# Patient Record
Sex: Female | Born: 1960
Health system: Southern US, Community
[De-identification: ages and names within clinical notes are randomized; demographics above are authoritative.]

## PROBLEM LIST (undated history)

## (undated) DIAGNOSIS — D649 Anemia, unspecified: Secondary | ICD-10-CM

## (undated) DIAGNOSIS — T7840XA Allergy, unspecified, initial encounter: Secondary | ICD-10-CM

## (undated) DIAGNOSIS — M8580 Other specified disorders of bone density and structure, unspecified site: Secondary | ICD-10-CM

## (undated) DIAGNOSIS — K219 Gastro-esophageal reflux disease without esophagitis: Secondary | ICD-10-CM

## (undated) DIAGNOSIS — F32A Depression, unspecified: Secondary | ICD-10-CM

## (undated) DIAGNOSIS — J381 Polyp of vocal cord and larynx: Secondary | ICD-10-CM

## (undated) DIAGNOSIS — G43909 Migraine, unspecified, not intractable, without status migrainosus: Secondary | ICD-10-CM

## (undated) DIAGNOSIS — E7800 Pure hypercholesterolemia, unspecified: Secondary | ICD-10-CM

## (undated) DIAGNOSIS — J309 Allergic rhinitis, unspecified: Secondary | ICD-10-CM

## (undated) DIAGNOSIS — H269 Unspecified cataract: Secondary | ICD-10-CM

## (undated) DIAGNOSIS — F419 Anxiety disorder, unspecified: Secondary | ICD-10-CM

## (undated) HISTORY — DX: Anemia, unspecified: D64.9

## (undated) HISTORY — DX: Migraine, unspecified, not intractable, without status migrainosus: G43.909

## (undated) HISTORY — DX: Anxiety disorder, unspecified: F41.9

## (undated) HISTORY — DX: Gastro-esophageal reflux disease without esophagitis: K21.9

## (undated) HISTORY — DX: Pure hypercholesterolemia, unspecified: E78.00

## (undated) HISTORY — DX: Other specified disorders of bone density and structure, unspecified site: M85.80

## (undated) HISTORY — DX: Polyp of vocal cord and larynx: J38.1

## (undated) HISTORY — DX: Unspecified cataract: H26.9

## (undated) HISTORY — DX: Allergic rhinitis, unspecified: J30.9

## (undated) HISTORY — PX: OTHER SURGICAL HISTORY: SHX169

## (undated) HISTORY — DX: Allergy, unspecified, initial encounter: T78.40XA

## (undated) HISTORY — DX: Depression, unspecified: F32.A

---

## 2004-12-01 ENCOUNTER — Ambulatory Visit: Payer: Self-pay | Admitting: Obstetrics and Gynecology

## 2004-12-08 ENCOUNTER — Ambulatory Visit: Payer: Self-pay | Admitting: Obstetrics and Gynecology

## 2005-08-09 ENCOUNTER — Ambulatory Visit: Payer: Self-pay | Admitting: Obstetrics and Gynecology

## 2006-08-25 ENCOUNTER — Ambulatory Visit: Payer: Self-pay | Admitting: Obstetrics and Gynecology

## 2007-12-20 ENCOUNTER — Ambulatory Visit: Payer: Self-pay | Admitting: Obstetrics and Gynecology

## 2009-09-16 ENCOUNTER — Ambulatory Visit: Payer: Self-pay

## 2009-09-30 ENCOUNTER — Ambulatory Visit: Payer: Self-pay

## 2009-12-03 ENCOUNTER — Ambulatory Visit: Payer: Self-pay

## 2010-08-21 ENCOUNTER — Ambulatory Visit: Payer: Self-pay | Admitting: Unknown Physician Specialty

## 2011-03-30 ENCOUNTER — Ambulatory Visit: Payer: Self-pay

## 2012-01-12 IMAGING — MG MAM DGTL SCREENING MAMMO W/CAD
1 series · 4 of 4 positions shown · non-contrast
Comparison: none

REASON FOR EXAM: screening
COMMENTS:  Submitted by practice: [facility] Scheduled by user: Heli-Maija
[place]in[place]

[R CC · right · 4 of 4 slices shown]
[im 1/4]
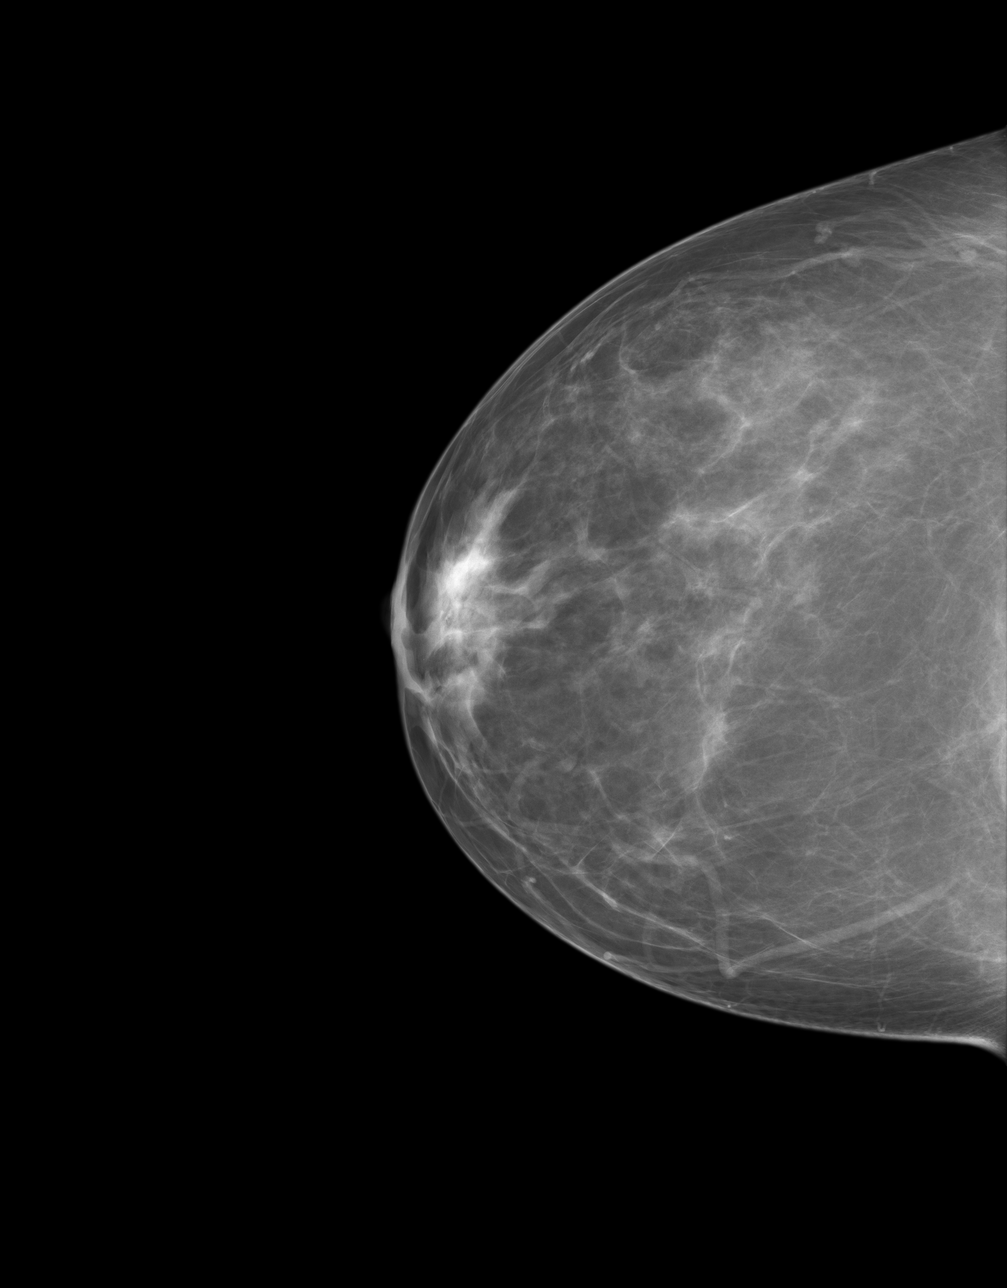
[im 2/4]
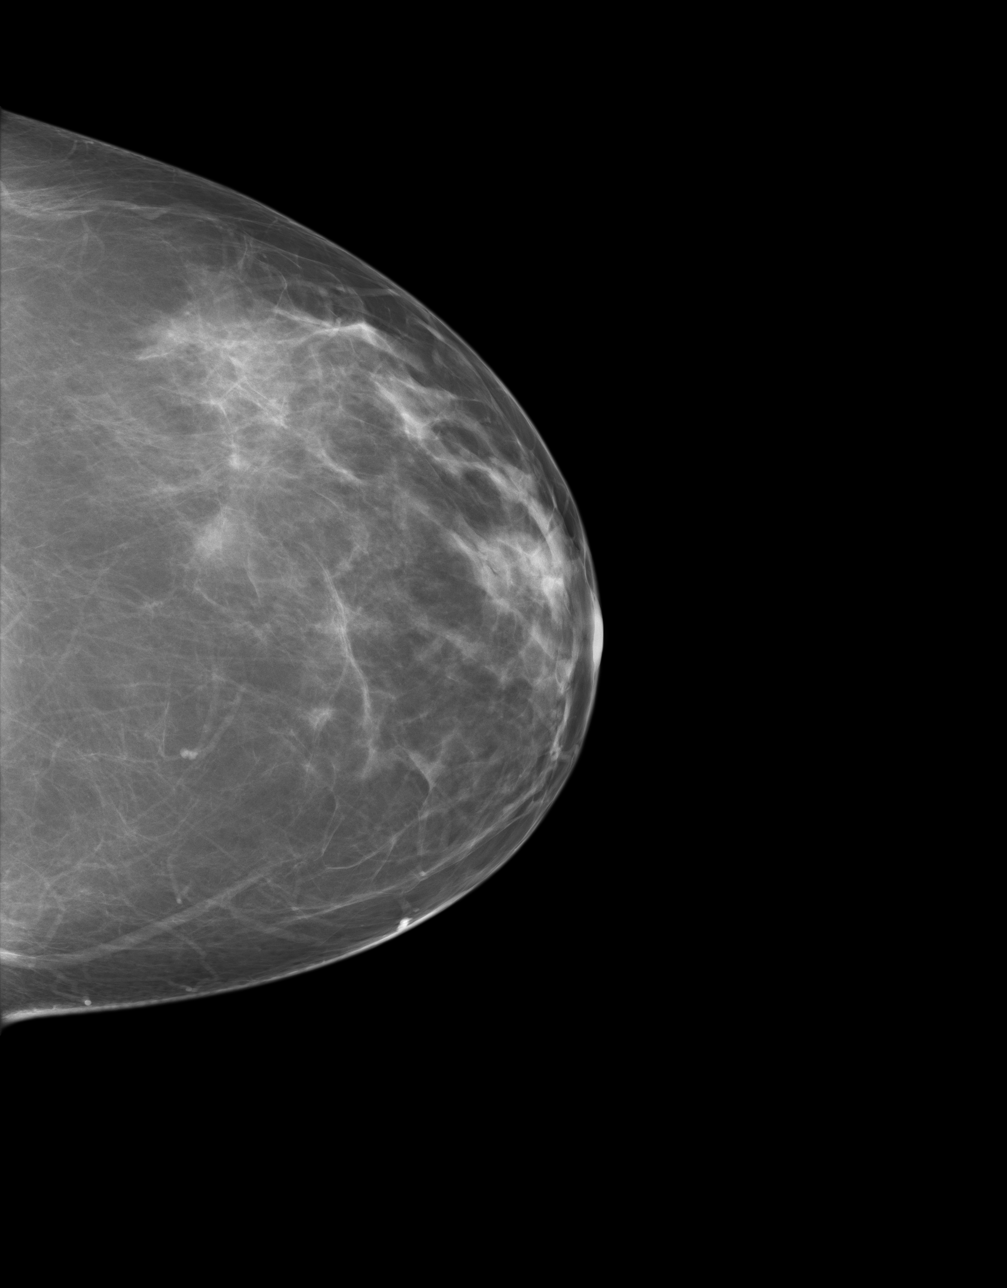
[im 3/4]
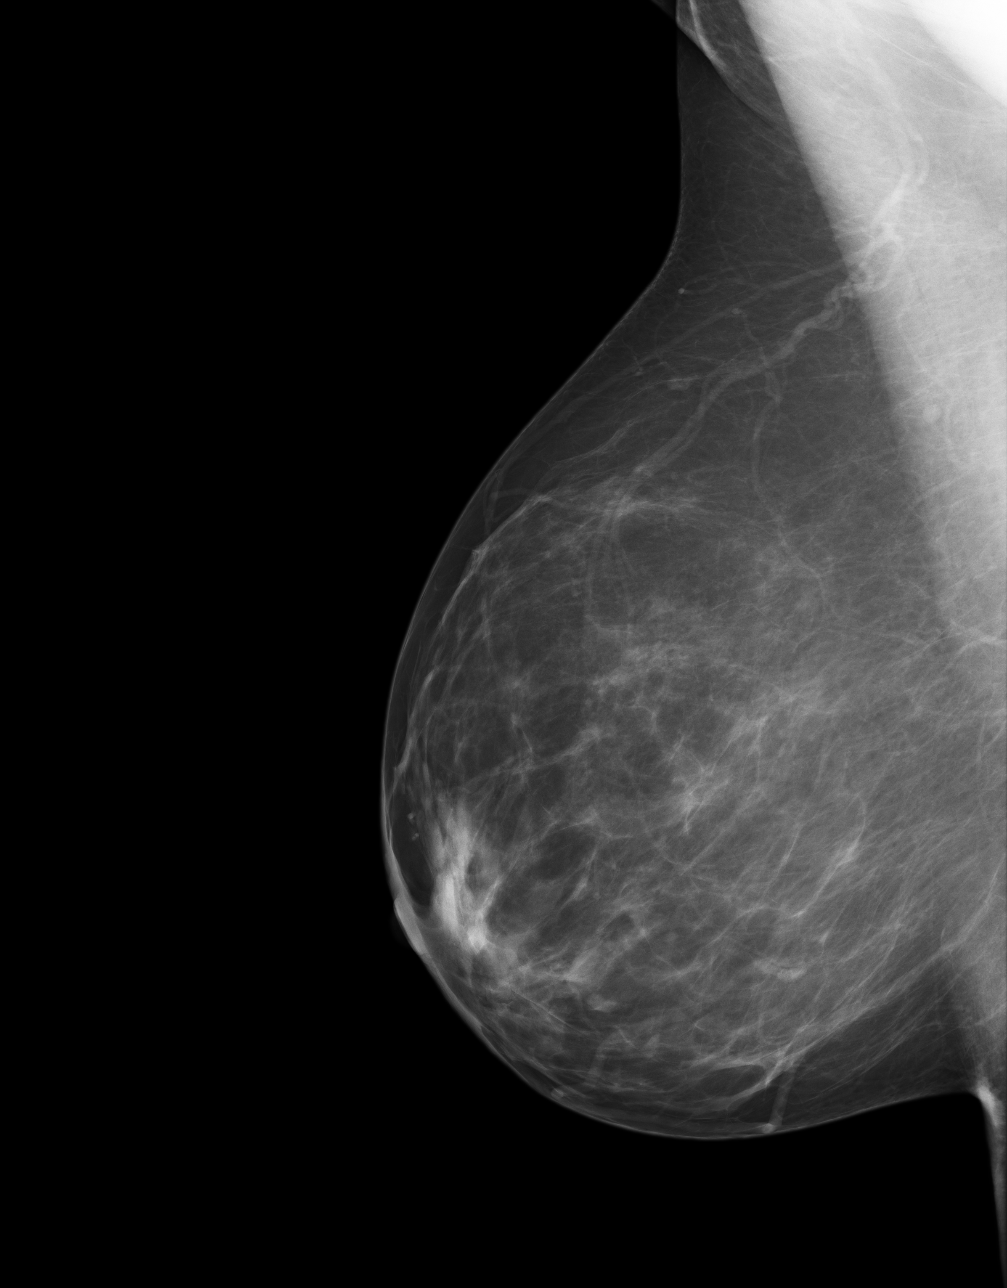
[im 4/4]
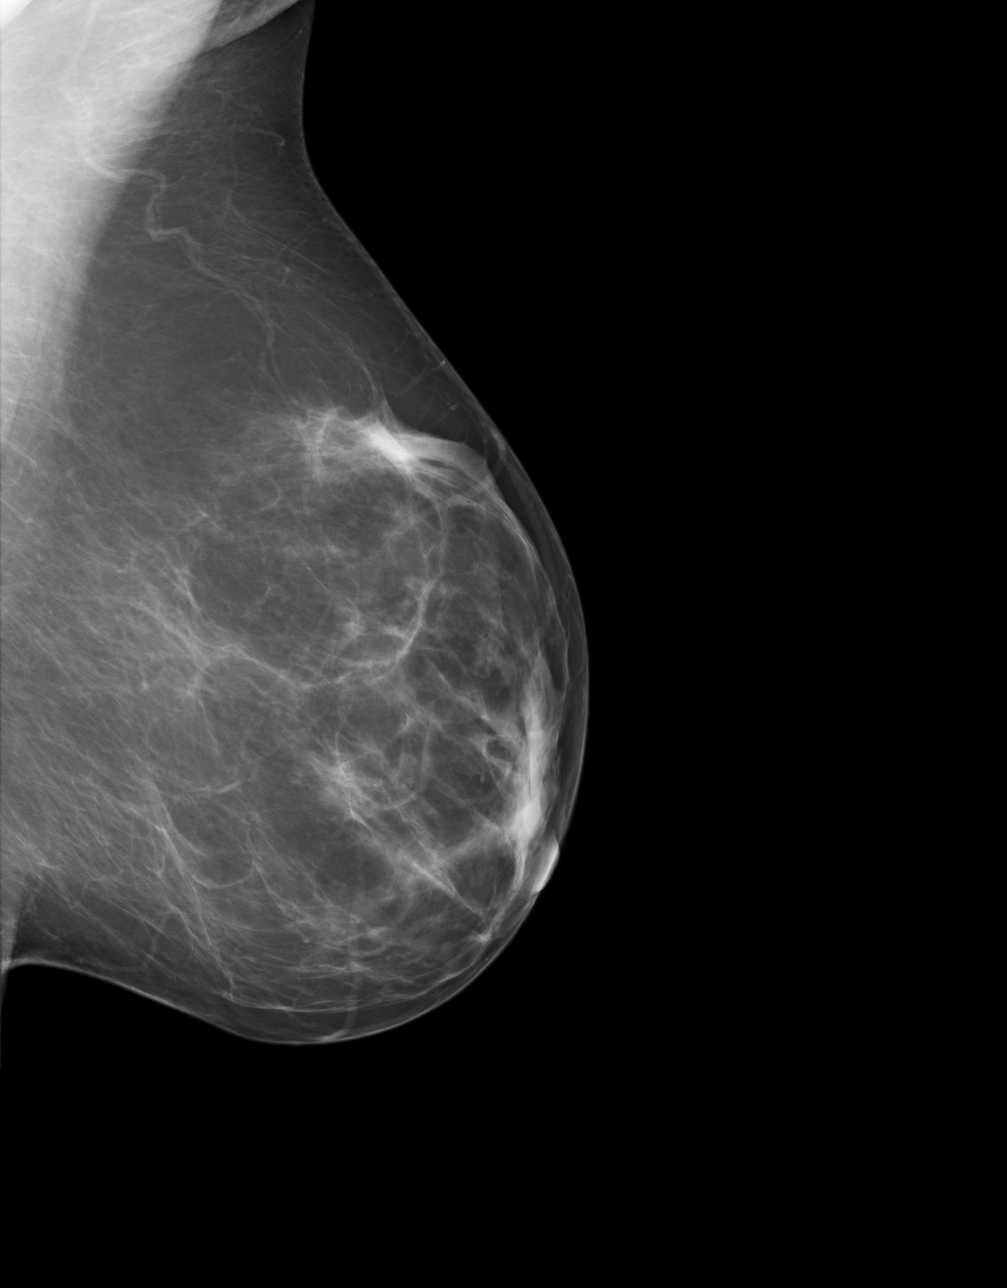

[4 of 4 positions shown; findings below may reference images not displayed]

PROCEDURE:     MAM - MAM DGTL SCREENING MAMMO W/CAD  - September 16, 2009  [DATE]

RESULT:       Comparison is made to prior studies dated 11/25/99, 08/25/06 and
12/20/07.

The breasts demonstrate a heterogenous parenchymal pattern.  An area of
asymmetric partially spiculated density projects in the central portion of
the left breast in a slightly lateral location approximately 8.0 cm from the
nipple.  Further evaluation with magnification compression imaging is
recommended. There is further mammographic evidence to suggest malignancy.
IMPRESSION: BI-RADS:  Category 0- Needs Additional Imaging.

A negative mammogram report does not preclude biopsy or other evaluation of
a clinically palpable or otherwise suspicious mass or lesion. Breast cancer
may not be detected by mammography in up to 10% of cases.

## 2012-05-30 ENCOUNTER — Ambulatory Visit: Payer: Self-pay

## 2013-01-18 HISTORY — PX: COLONOSCOPY: SHX174

## 2013-03-24 ENCOUNTER — Emergency Department: Payer: Self-pay | Admitting: Emergency Medicine

## 2013-08-21 ENCOUNTER — Ambulatory Visit: Payer: Self-pay

## 2013-10-12 ENCOUNTER — Ambulatory Visit: Payer: Self-pay | Admitting: Gastroenterology

## 2013-10-12 LAB — HM COLONOSCOPY

## 2015-08-27 ENCOUNTER — Other Ambulatory Visit: Payer: Self-pay | Admitting: Certified Nurse Midwife

## 2015-08-27 DIAGNOSIS — Z1231 Encounter for screening mammogram for malignant neoplasm of breast: Secondary | ICD-10-CM

## 2015-09-12 ENCOUNTER — Other Ambulatory Visit: Payer: Self-pay | Admitting: Certified Nurse Midwife

## 2015-09-12 DIAGNOSIS — Z13820 Encounter for screening for osteoporosis: Secondary | ICD-10-CM

## 2015-09-17 ENCOUNTER — Ambulatory Visit: Payer: Self-pay

## 2015-10-13 ENCOUNTER — Ambulatory Visit
Admission: RE | Admit: 2015-10-13 | Discharge: 2015-10-13 | Disposition: A | Discharge status: Home / Self Care | Payer: 59 | Source: Ambulatory Visit | Attending: Certified Nurse Midwife | Admitting: Certified Nurse Midwife

## 2015-10-13 ENCOUNTER — Other Ambulatory Visit: Payer: Self-pay | Admitting: Certified Nurse Midwife

## 2015-10-13 DIAGNOSIS — Z13820 Encounter for screening for osteoporosis: Secondary | ICD-10-CM | POA: Insufficient documentation

## 2015-10-13 DIAGNOSIS — M8580 Other specified disorders of bone density and structure, unspecified site: Secondary | ICD-10-CM | POA: Diagnosis not present

## 2015-10-13 DIAGNOSIS — Z1231 Encounter for screening mammogram for malignant neoplasm of breast: Secondary | ICD-10-CM

## 2015-10-17 ENCOUNTER — Other Ambulatory Visit: Payer: Self-pay | Admitting: *Deleted

## 2015-10-17 ENCOUNTER — Inpatient Hospital Stay
Admission: RE | Admit: 2015-10-17 | Discharge: 2015-10-17 | Disposition: A | Discharge status: Home / Self Care | Payer: Self-pay | Source: Ambulatory Visit | Attending: *Deleted | Admitting: *Deleted

## 2015-10-17 DIAGNOSIS — Z9289 Personal history of other medical treatment: Secondary | ICD-10-CM

## 2016-04-13 DIAGNOSIS — G43019 Migraine without aura, intractable, without status migrainosus: Secondary | ICD-10-CM | POA: Diagnosis not present

## 2016-04-15 DIAGNOSIS — G43109 Migraine with aura, not intractable, without status migrainosus: Secondary | ICD-10-CM | POA: Diagnosis not present

## 2016-04-20 DIAGNOSIS — H61119 Acquired deformity of pinna, unspecified ear: Secondary | ICD-10-CM | POA: Diagnosis not present

## 2016-04-30 DIAGNOSIS — R200 Anesthesia of skin: Secondary | ICD-10-CM | POA: Diagnosis not present

## 2016-04-30 DIAGNOSIS — G43119 Migraine with aura, intractable, without status migrainosus: Secondary | ICD-10-CM | POA: Diagnosis not present

## 2016-05-07 ENCOUNTER — Other Ambulatory Visit: Payer: Self-pay | Admitting: Neurology

## 2016-05-07 DIAGNOSIS — G43119 Migraine with aura, intractable, without status migrainosus: Secondary | ICD-10-CM

## 2016-05-21 ENCOUNTER — Ambulatory Visit
Admission: RE | Admit: 2016-05-21 | Discharge: 2016-05-21 | Disposition: A | Discharge status: Home / Self Care | Payer: Commercial Managed Care - HMO | Source: Ambulatory Visit | Attending: Neurology | Admitting: Neurology

## 2016-05-21 DIAGNOSIS — G43119 Migraine with aura, intractable, without status migrainosus: Secondary | ICD-10-CM

## 2016-05-21 DIAGNOSIS — R42 Dizziness and giddiness: Secondary | ICD-10-CM | POA: Diagnosis not present

## 2016-05-21 MED ORDER — GADOBENATE DIMEGLUMINE 529 MG/ML IV SOLN
20.0000 mL | Freq: Once | INTRAVENOUS | Status: AC | PRN
  Administered 2016-05-21: 18 mL via INTRAVENOUS

## 2016-09-28 ENCOUNTER — Other Ambulatory Visit: Payer: Self-pay | Admitting: Certified Nurse Midwife

## 2016-09-28 NOTE — Telephone Encounter (Signed)
Please advise for refill. Thank you.  

## 2016-10-27 ENCOUNTER — Other Ambulatory Visit: Payer: Self-pay | Admitting: Certified Nurse Midwife

## 2016-11-29 ENCOUNTER — Other Ambulatory Visit: Payer: Self-pay | Admitting: Certified Nurse Midwife

## 2016-12-06 ENCOUNTER — Encounter: Payer: Self-pay | Admitting: Certified Nurse Midwife

## 2016-12-06 ENCOUNTER — Ambulatory Visit (INDEPENDENT_AMBULATORY_CARE_PROVIDER_SITE_OTHER): Payer: 59 | Admitting: Certified Nurse Midwife

## 2016-12-06 VITALS — BP 128/84 | HR 81 | Ht 65.75 in | Wt 202.0 lb

## 2016-12-06 DIAGNOSIS — G43909 Migraine, unspecified, not intractable, without status migrainosus: Secondary | ICD-10-CM | POA: Insufficient documentation

## 2016-12-06 DIAGNOSIS — E7800 Pure hypercholesterolemia, unspecified: Secondary | ICD-10-CM | POA: Insufficient documentation

## 2016-12-06 DIAGNOSIS — J309 Allergic rhinitis, unspecified: Secondary | ICD-10-CM | POA: Insufficient documentation

## 2016-12-06 DIAGNOSIS — M8580 Other specified disorders of bone density and structure, unspecified site: Secondary | ICD-10-CM | POA: Insufficient documentation

## 2016-12-06 DIAGNOSIS — Z01419 Encounter for gynecological examination (general) (routine) without abnormal findings: Secondary | ICD-10-CM

## 2016-12-06 DIAGNOSIS — Z124 Encounter for screening for malignant neoplasm of cervix: Secondary | ICD-10-CM

## 2016-12-06 DIAGNOSIS — Z1231 Encounter for screening mammogram for malignant neoplasm of breast: Secondary | ICD-10-CM | POA: Diagnosis not present

## 2016-12-06 DIAGNOSIS — J381 Polyp of vocal cord and larynx: Secondary | ICD-10-CM | POA: Insufficient documentation

## 2016-12-06 DIAGNOSIS — Z1239 Encounter for other screening for malignant neoplasm of breast: Secondary | ICD-10-CM

## 2016-12-06 DIAGNOSIS — G43119 Migraine with aura, intractable, without status migrainosus: Secondary | ICD-10-CM | POA: Insufficient documentation

## 2016-12-06 DIAGNOSIS — M85851 Other specified disorders of bone density and structure, right thigh: Secondary | ICD-10-CM | POA: Insufficient documentation

## 2016-12-06 MED ORDER — PROGESTERONE MICRONIZED 100 MG PO CAPS: 100.0000 mg | ORAL_CAPSULE | Freq: Every day | ORAL | 3 refills | Status: DC

## 2016-12-06 MED ORDER — ESTRADIOL 0.52 MG/0.87 GM (0.06%) TD GEL: 1.0000 "application " | Freq: Every day | TRANSDERMAL | 4 refills | Status: DC

## 2016-12-06 NOTE — Progress Notes (Signed)
Gynecology Annual Exam  PCP: Patient, No Pcp Per  Chief Complaint:  Chief Complaint  Patient presents with  . Gynecologic Exam    History of Present Illness:Margaret Fuller presents today for her annual exam. She is a 56 year old Caucasian/White female , G 3 P 1 0 2 1 , whose LMP was 6 years ago. . She is having no significant GYN problems.  Her menses are absent and she is postmenopausal. She currently uses Elestrin -1pump daily and Prometrium 100 mgm daily.   She has had no spotting.   The patient's past medical history is remarkable for migraine headaches, hypercholesterolemia, allergic rhinitis, and vocal cord polyps (has had excision x 3 ) Since her last annual GYN exam dated 10/13/2015 , she has been diagnosed with some anxiety/depression by her neurologist and started on some Zoloft. This problem has arisen from a stressful relationship with a boss at work. .  She is sexually active. She does not have vaginal dryness.   Her most recent pap smear was obtained 09/11/2015 and was NIL Her most recent mammogram obtained on 10/13/2015 was normal and revealed no significant changes. There is no family history of breast cancer. There is no family history of ovarian cancer. The patient does do monthly self breast exams.  She had a colonoscopy in 2015 that was normal. Her next colonoscopy is due in 5 years.  She had a recent DEXA scan obtained in 10/13/2015 that showed stable osteopenia. T score of spine -1.5 and of femur -1.6  The patient does not smoke.  The patient does drink an average of 1-2/week. The patient does not use illegal drugs.  The patient does not exercise.  The patient does get adequate calcium in her diet and wih her supplement.  She has hypercholesterolemia and has stopped taking her Welchol. Has not found a new PCP yet. Given some names of PCPs in area taking new patients      Review of Systems: Review of Systems  Constitutional: Negative for chills, fever  and weight loss.       Positive for weight gain of 5#  HENT: Negative for congestion, sinus pain and sore throat.   Eyes: Negative for blurred vision and pain.  Respiratory: Negative for hemoptysis, shortness of breath and wheezing.   Cardiovascular: Negative for chest pain, palpitations and leg swelling.  Gastrointestinal: Positive for heartburn. Negative for abdominal pain, blood in stool, diarrhea, nausea and vomiting.  Genitourinary: Negative for dysuria, frequency, hematuria and urgency.  Musculoskeletal: Negative for back pain, joint pain and myalgias.  Skin: Negative for itching and rash.  Neurological: Positive for headaches. Negative for dizziness and tingling.  Endo/Heme/Allergies: Negative for environmental allergies and polydipsia. Does not bruise/bleed easily.       Negative for hirsutism   Psychiatric/Behavioral: Positive for depression. The patient is nervous/anxious. The patient does not have insomnia.     Past Medical History:  Past Medical History:  Diagnosis Date  . Allergic rhinitis   . Anemia   . Hypercholesterolemia   . Migraine   . Osteopenia   . Vocal cord polyp     Past Surgical History:  Past Surgical History:  Procedure Laterality Date  . COLONOSCOPY  2015   negative  . excision of polyps from vocal cords  1970, 1984, 1985    Family History:  Family History  Problem Relation Age of Onset  . Diabetes Mother   . Colon polyps Mother 42  . Heart disease  Mother        MI has pacemaker  . Hypertension Mother   . Diabetes Father   . Skin cancer Maternal Aunt   . Squamous cell carcinoma Sister        skin on arm    Social History:  Social History   Socioeconomic History  . Marital status: Married    Spouse name: Not on file  . Number of children: 1  . Years of education: Not on file  . Highest education level: Not on file  Social Needs  . Financial resource strain: Not on file  . Food insecurity - worry: Not on file  . Food insecurity -  inability: Not on file  . Transportation needs - medical: Not on file  . Transportation needs - non-medical: Not on file  Occupational History  . Occupation: Environmental health practitionerAdministrative Assistant, Passenger transport manageregistrar  Tobacco Use  . Smoking status: Never Smoker  . Smokeless tobacco: Never Used  Substance and Sexual Activity  . Alcohol use: Yes    Comment: 1/week  . Drug use: No  . Sexual activity: Yes    Birth control/protection: Post-menopausal  Other Topics Concern  . Not on file  Social History Narrative   Daughter going to [employer] and taking theater and communications    Allergies:  Allergies  Allergen Reactions  . Statins Other (See Comments)    Myopathy     Medications: Prior to Admission medications   Medication Sig Start Date End Date Taking? Authorizing Provider  B Complex Vitamins (VITAMIN-B COMPLEX) TABS Take by mouth.   Yes [provider]  Calcium Carbonate-Vitamin D3 (CALCIUM 600-D) 600-400 MG-UNIT TABS Take by mouth.   Yes [provider]  cetirizine (ZYRTEC) 10 MG tablet Take by mouth.   Yes [provider]  Estradiol (ELESTRIN) 0.52 MG/0.87 GM (0.06%) GEL Apply topically.   Yes [provider]  FLUZONE QUADRIVALENT 0.5 ML injection inject 0.5 milliliter intramuscularly 10/17/16  Yes [provider]  Misc Natural Products (PETADOLEX 75) 75 MG CAPS Take by mouth.   Yes [provider]  Multiple Vitamin (MULTIVITAMIN) tablet Take by mouth.   Yes [provider]  progesterone (PROMETRIUM) 100 MG capsule TAKE 1 CAPSULE BY MOUTH EVERY DAY 11/29/16  Yes Farrel ConnersGutierrez, Heaven Meeker, CNM  sertraline (ZOLOFT) 50 MG tablet Take by mouth. 12/02/16 03/02/17 Yes [provider]  SUMAtriptan (IMITREX) 100 MG tablet Take by mouth. 12/02/16  Yes [provider]  tretinoin (RETIN-A) 0.01 % gel Apply topically.   Yes [provider]    Physical Exam Vitals: BP 128/84   Pulse 81   Ht 5' 5.75" (1.67 m)   Wt 202  lb (91.6 kg)   BMI 32.85 kg/m   General: WF in  NAD HEENT: normocephalic, anicteric Neck: no thyroid enlargement, no palpable nodules, no cervical lymphadenopathy  Pulmonary: No increased work of breathing, CTAB Cardiovascular: RRR, without murmur  Breast: Breast symmetrical, no tenderness, no palpable nodules or masses, no skin or nipple retraction present, no nipple discharge.  No axillary, infraclavicular or supraclavicular lymphadenopathy. Abdomen: Soft, non-tender, non-distended.  Umbilicus without lesions.  No hepatomegaly or masses palpable. No evidence of hernia. Genitourinary:  External: Normal external female genitalia.  Normal urethral meatus, normal Bartholin's and Skene's glands.    Vagina: Normal vaginal mucosa, no evidence of prolapse.    Cervix: Grossly normal in appearance, no bleeding, non-tender  Uterus: Anteverted, normal size, shape, and consistency, mobile, and non-tender  Adnexa: No adnexal masses, non-tender  Rectal:  deferred  Lymphatic: no evidence of inguinal lymphadenopathy Extremities: no edema, erythema, or tenderness Neurologic: Grossly intact Psychiatric: mood appropriate, affect full     Assessment: 56 y.o. [id number]   Plan:   1) Breast cancer screening - recommend monthly self breast exam and annual screening mammograms. Mammogram was ordered today. [facility]  2) Colon cancer screening: Patient stated that Dr Deon PillingSkilski wanted her to have another colonoscopy in 3 years (this year), but they agreed on every 5 years.  3) Cervical cancer screening - Pap was done.  4) Encouraged patient to get a PCP. Discussed exercise to help with weight loss and in prevention of osteoporosis  5) RTO 1 year for annual and prn.   Farrel Connersolleen Samuele Storey, CNM

## 2016-12-08 LAB — IGP, APTIMA HPV
HPV Aptima: NEGATIVE
PAP Smear Comment: 0

## 2017-01-04 ENCOUNTER — Other Ambulatory Visit: Payer: Self-pay

## 2017-01-04 MED ORDER — ESTRADIOL 0.52 MG/0.87 GM (0.06%) TD GEL: 1.0000 "application " | Freq: Every day | TRANSDERMAL | 1 refills | Status: DC

## 2017-12-08 ENCOUNTER — Other Ambulatory Visit: Payer: Self-pay | Admitting: Certified Nurse Midwife

## 2018-01-09 ENCOUNTER — Other Ambulatory Visit: Payer: Self-pay | Admitting: Certified Nurse Midwife

## 2018-01-10 ENCOUNTER — Other Ambulatory Visit: Payer: Self-pay | Admitting: Certified Nurse Midwife

## 2018-02-02 NOTE — Progress Notes (Signed)
Gynecology Annual Exam  PCP: Patient, No Pcp Per  Chief Complaint:  Chief Complaint  Patient presents with  . Gynecologic Exam    No complaints    History of Present Illness:Margaret Fuller presents today for her annual exam. She is a 58 year old Caucasian/White female , G 3 P 1 0 2 1 , whose LMP was 7-8 years ago. . She is having no significant GYN problems.  Her menses are absent and she is postmenopausal. She currently uses Elestrin -1pump daily and Prometrium 100 mgm daily.   She has had no spotting.   The patient's past medical history is remarkable for migraine headaches, hypercholesterolemia, allergic rhinitis, anxiety/depression, and vocal cord polyps (has had excision x 3 ) Since her last annual GYN exam dated 12/06/2016 , she has had no significant changes in her health.  .  She is sexually active. She does not have vaginal dryness.   Her most recent pap smear was obtained 12/06/2016 and was NIL with negative HRHPV. Her most recent mammogram obtained on 10/13/2015 was normal and revealed no significant changes. There is no family history of breast cancer. There is no family history of ovarian cancer. The patient does do monthly self breast exams.  She had a colonoscopy in 2015 that was normal. Her next colonoscopy is due this year She had a recent DEXA scan obtained in 10/13/2015 that showed stable osteopenia. T score of spine -1.5 and of femur -1.6  The patient does not smoke.  The patient does drink an average of 1-2/week. The patient does not use illegal drugs.  The patient does not exercise.  The patient does get adequate calcium in her diet.  She has hypercholesterolemia and has stopped taking her Welchol in the past. Has not found a new PCP yet. Given some names of PCPs in area taking new patients      Review of Systems: Review of Systems  Constitutional: Negative for chills, fever and weight loss.       Positive for weight loss of 6#  HENT: Negative  for congestion, sinus pain and sore throat.   Eyes: Negative for blurred vision and pain.  Respiratory: Negative for hemoptysis, shortness of breath and wheezing.   Cardiovascular: Negative for chest pain, palpitations and leg swelling.  Gastrointestinal: Positive for heartburn. Negative for abdominal pain, blood in stool, diarrhea, nausea and vomiting.  Genitourinary: Negative for dysuria, frequency, hematuria and urgency.  Musculoskeletal: Negative for back pain, joint pain and myalgias.  Skin: Negative for itching and rash.  Neurological: Positive for headaches. Negative for dizziness and tingling.  Endo/Heme/Allergies: Negative for environmental allergies and polydipsia. Does not bruise/bleed easily.       Negative for hirsutism   Psychiatric/Behavioral: Negative for depression. The patient is nervous/anxious. The patient does not have insomnia.     Past Medical History:  Past Medical History:  Diagnosis Date  . Allergic rhinitis   . Anemia   . Hypercholesterolemia   . Migraine   . Osteopenia   . Vocal cord polyp     Past Surgical History:  Past Surgical History:  Procedure Laterality Date  . COLONOSCOPY  2015   negative  . excision of polyps from vocal cords  1970, 1984, 1985    Family History:  Family History  Problem Relation Age of Onset  . Diabetes Mother   . Colon polyps Mother 78  . Heart disease Mother        MI has pacemaker  .  Hypertension Mother   . Diabetes Father   . Skin cancer Maternal Aunt   . Squamous cell carcinoma Sister        skin on arm    Social History:  Social History   Socioeconomic History  . Marital status: Married    Spouse name: Not on file  . Number of children: 1  . Years of education: Not on file  . Highest education level: Not on file  Occupational History  . Occupation: Environmental health practitionerAdministrative Assistant, Passenger transport manageregistrar  Social Needs  . Financial resource strain: Not on file  . Food insecurity:    Worry: Not on file    Inability:  Not on file  . Transportation needs:    Medical: Not on file    Non-medical: Not on file  Tobacco Use  . Smoking status: Never Smoker  . Smokeless tobacco: Never Used  Substance and Sexual Activity  . Alcohol use: Yes    Comment: 1/week  . Drug use: No  . Sexual activity: Yes    Birth control/protection: Post-menopausal  Lifestyle  . Physical activity:    Days per week: 3 days    Minutes per session: 30 min  . Stress: Only a little  Relationships  . Social connections:    Talks on phone: Three times a week    Gets together: Once a week    Attends religious service: More than 4 times per year    Active member of club or organization: No    Attends meetings of clubs or organizations: Never    Relationship status: Married  . Intimate partner violence:    Fear of current or ex partner: No    Emotionally abused: No    Physically abused: No    Forced sexual activity: No  Other Topics Concern  . Not on file  Social History Narrative   Daughter going to [employer] and taking theater and communications    Allergies:  Allergies  Allergen Reactions  . Statins Other (See Comments)    Myopathy     Medications:  Current Outpatient Medications on File Prior to Visit  Medication Sig Dispense Refill  . B Complex Vitamins (VITAMIN-B COMPLEX) TABS Take by mouth.    . Calcium Carbonate-Vitamin D3 (CALCIUM 600-D) 600-400 MG-UNIT TABS Take by mouth.    . cetirizine (ZYRTEC) 10 MG tablet Take by mouth.    . Misc Natural Products (PETADOLEX 75) 75 MG CAPS Take by mouth.    . Multiple Vitamin (MULTIVITAMIN) tablet Take by mouth.    . sertraline (ZOLOFT) 100 MG tablet     . SUMAtriptan (IMITREX) 100 MG tablet Take by mouth.    Marland Kitchen. FLUZONE QUADRIVALENT 0.5 ML injection inject 0.5 milliliter intramuscularly  0   No current facility-administered medications on file prior to visit.    Physical Exam Vitals: BP (!) 110/7 (BP Location: Right Arm, Patient Position: Sitting, Cuff Size:  Normal)   Pulse 90   Ht 5' 5.75" (1.67 m)   Wt 196 lb (88.9 kg)   BMI 31.88 kg/m   General: WF in  NAD HEENT: normocephalic, anicteric Neck: no thyroid enlargement, no palpable nodules, no cervical lymphadenopathy  Pulmonary: No increased work of breathing, CTAB Cardiovascular: RRR, without murmur  Breast: Breast symmetrical, no tenderness, no palpable nodules or masses, no skin or nipple retraction present, no nipple discharge.  No axillary, infraclavicular or supraclavicular lymphadenopathy. Abdomen: Soft, non-tender, non-distended.  Umbilicus without lesions.  No hepatomegaly or masses palpable. No evidence of hernia.  Genitourinary:  External: Normal external female genitalia.  Normal urethral meatus, normal Bartholin's and Skene's glands.    Vagina: Normal vaginal mucosa, no evidence of prolapse.    Cervix: Grossly normal in appearance, no bleeding, non-tender  Uterus: Retroverted, normal size, shape, and consistency, mobile, and non-tender  Adnexa: No adnexal masses, non-tender  Rectal: deferred  Lymphatic: no evidence of inguinal lymphadenopathy Extremities: no edema, erythema, or tenderness Neurologic: Grossly intact Psychiatric: mood appropriate, affect full     Assessment: 58 y.o. [id number] well woman exam Postmenopausal -on Hormone therapy History of hyperlipidemia  Plan:   1) Breast cancer screening - recommend monthly self breast exam and annual screening mammograms. Mammogram was ordered today. [facility]  2) Colon cancer screening: Patient stated that Dr Ricki RodriguezSkulski wanted her to have another colonoscopy in 3 years (this year), but they agreed on every 5 years because of her mother's history of colon polyps. Patient disinclined to do a colonoscopy due to time away from work and colon cleanse.. Discussed other options for colon cancer screening. Would like to do Cologuard. This was ordered.  3) Cervical cancer screening - Pap was  done.  4) Encouraged patient to get a PCP for treatment of hyperlipidemia.  [facility] [address]  5) Osteopenia: consider getting another DEXA at age 58.  285) RTO 1 year for annual and prn.   Farrel Connersolleen Tasheka Houseman, CNM

## 2018-02-03 ENCOUNTER — Ambulatory Visit (INDEPENDENT_AMBULATORY_CARE_PROVIDER_SITE_OTHER): Payer: 59 | Admitting: Certified Nurse Midwife

## 2018-02-03 ENCOUNTER — Encounter: Payer: Self-pay | Admitting: Certified Nurse Midwife

## 2018-02-03 ENCOUNTER — Other Ambulatory Visit (HOSPITAL_COMMUNITY)
Admission: RE | Admit: 2018-02-03 | Discharge: 2018-02-03 | Disposition: A | Discharge status: Home / Self Care | Payer: 59 | Source: Ambulatory Visit | Attending: Certified Nurse Midwife | Admitting: Certified Nurse Midwife

## 2018-02-03 ENCOUNTER — Other Ambulatory Visit: Payer: Self-pay

## 2018-02-03 VITALS — BP 110/71 | HR 90 | Ht 65.75 in | Wt 196.0 lb

## 2018-02-03 DIAGNOSIS — E7800 Pure hypercholesterolemia, unspecified: Secondary | ICD-10-CM

## 2018-02-03 DIAGNOSIS — Z1239 Encounter for other screening for malignant neoplasm of breast: Secondary | ICD-10-CM

## 2018-02-03 DIAGNOSIS — Z1211 Encounter for screening for malignant neoplasm of colon: Secondary | ICD-10-CM

## 2018-02-03 DIAGNOSIS — Z01419 Encounter for gynecological examination (general) (routine) without abnormal findings: Secondary | ICD-10-CM | POA: Insufficient documentation

## 2018-02-03 DIAGNOSIS — G43809 Other migraine, not intractable, without status migrainosus: Secondary | ICD-10-CM

## 2018-02-03 DIAGNOSIS — Z124 Encounter for screening for malignant neoplasm of cervix: Secondary | ICD-10-CM | POA: Insufficient documentation

## 2018-02-03 MED ORDER — PROGESTERONE MICRONIZED 100 MG PO CAPS
100.0000 mg | ORAL_CAPSULE | Freq: Every day | ORAL | 3 refills | Status: DC
Start: 2018-02-03 — End: 2019-02-07

## 2018-02-03 MED ORDER — ESTRADIOL 0.52 MG/0.87 GM (0.06%) TD GEL: TRANSDERMAL | 3 refills | Status: DC

## 2018-02-07 NOTE — Addendum Note (Signed)
Addended by: Farrel Conners on: 02/07/2018 07:10 PM   Modules accepted: Orders

## 2018-02-08 LAB — CYTOLOGY - PAP: Diagnosis: NEGATIVE

## 2018-02-09 ENCOUNTER — Other Ambulatory Visit: Payer: Self-pay | Admitting: Certified Nurse Midwife

## 2018-03-06 ENCOUNTER — Other Ambulatory Visit: Payer: Self-pay | Admitting: Certified Nurse Midwife

## 2018-03-06 ENCOUNTER — Ambulatory Visit
Admission: RE | Admit: 2018-03-06 | Discharge: 2018-03-06 | Disposition: A | Discharge status: Home / Self Care | Payer: 59 | Source: Ambulatory Visit | Attending: Certified Nurse Midwife | Admitting: Certified Nurse Midwife

## 2018-03-06 DIAGNOSIS — Z1239 Encounter for other screening for malignant neoplasm of breast: Secondary | ICD-10-CM

## 2018-03-06 DIAGNOSIS — N6489 Other specified disorders of breast: Secondary | ICD-10-CM

## 2018-03-06 DIAGNOSIS — Z1231 Encounter for screening mammogram for malignant neoplasm of breast: Secondary | ICD-10-CM | POA: Diagnosis not present

## 2018-03-06 DIAGNOSIS — R928 Other abnormal and inconclusive findings on diagnostic imaging of breast: Secondary | ICD-10-CM

## 2018-03-15 ENCOUNTER — Encounter: Payer: Self-pay | Admitting: Certified Nurse Midwife

## 2018-03-16 ENCOUNTER — Ambulatory Visit
Admission: RE | Admit: 2018-03-16 | Discharge: 2018-03-16 | Disposition: A | Discharge status: Home / Self Care | Payer: 59 | Source: Ambulatory Visit | Attending: Certified Nurse Midwife | Admitting: Certified Nurse Midwife

## 2018-03-16 DIAGNOSIS — R928 Other abnormal and inconclusive findings on diagnostic imaging of breast: Secondary | ICD-10-CM

## 2018-03-16 DIAGNOSIS — N6489 Other specified disorders of breast: Secondary | ICD-10-CM | POA: Insufficient documentation

## 2018-03-17 ENCOUNTER — Other Ambulatory Visit: Payer: Self-pay | Admitting: Certified Nurse Midwife

## 2018-03-17 DIAGNOSIS — N6489 Other specified disorders of breast: Secondary | ICD-10-CM

## 2018-03-17 DIAGNOSIS — R928 Other abnormal and inconclusive findings on diagnostic imaging of breast: Secondary | ICD-10-CM

## 2018-03-21 LAB — COLOGUARD

## 2018-03-27 ENCOUNTER — Telehealth: Payer: Self-pay

## 2018-03-27 NOTE — Telephone Encounter (Signed)
Pt aware of neg cologuard results.

## 2018-07-06 ENCOUNTER — Other Ambulatory Visit: Payer: Self-pay | Admitting: Certified Nurse Midwife

## 2018-07-06 MED ORDER — ELESTRIN 0.52 MG/0.87 GM (0.06%) TD GEL: TRANSDERMAL | 2 refills | Status: DC

## 2019-02-07 ENCOUNTER — Other Ambulatory Visit: Payer: Self-pay | Admitting: Certified Nurse Midwife

## 2019-02-07 MED ORDER — PROGESTERONE MICRONIZED 100 MG PO CAPS
100.0000 mg | ORAL_CAPSULE | Freq: Every day | ORAL | 0 refills | Status: DC
Start: 2019-02-07 — End: 2019-05-07

## 2019-02-11 NOTE — Progress Notes (Addendum)
Gynecology Annual Exam  PCP: Patient, No Pcp Per  Chief Complaint:  Chief Complaint  Patient presents with  . Gynecologic Exam    History of Present Illness:Margaret Fuller presents today for her annual exam. She is a 59 year old Caucasian/White female , G 3 P 1 0 2 1 , whose LMP was 8-9 years ago. . She is having no significant GYN problems.  Her menses are absent and she is postmenopausal. She currently uses Elestrin -1pump daily and Prometrium 100 mgm daily.   She has had no spotting.   The patient's past medical history is remarkable for migraine headaches, hypercholesterolemia, allergic rhinitis, anxiety/depression, and vocal cord polyps (has had excision x 3 ) Since her last annual GYN exam dated 02/03/2018 , she has had no significant changes in her health.  .  She is sexually active. She does not have vaginal dryness.   Her most recent pap smear was obtained 02/03/2018 and was NIL Her most recent mammogram obtained on 03/06/18 and was Birads 0. After additional views and ultrasound for right breast asymmetry, the results were Birads 3 and it was recommended that she have a repeat diagnostic mammogram in 6 mos but that was not done.   There is no family history of breast cancer. There is no family history of ovarian cancer. The patient does do monthly self breast exams.  She had a colonoscopy in 2015 that was normal. Her next colonoscopy was due last year. She decided to have a Cologuard instead and it was negative. She had a recent DEXA scan obtained in 10/13/2015 that showed stable osteopenia. T score of spine -1.5 and of femur -1.6  The patient does not smoke.  The patient does drink an average of 0-3/week. The patient does not use illegal drugs.  The patient does exercise by walking.  The patient usually does get adequate calcium in her diet. Occasionally takes a calcium + vitamin D3 supplement She has hypercholesterolemia and has stopped taking her Welchol in the  past. Has not found a new PCP yet. Given some names of PCPs in area taking new patients      Review of Systems: Review of Systems  Constitutional: Negative for chills, fever and weight loss.       Positive for weight loss of 6#  HENT: Positive for ear pain. Negative for congestion, sinus pain and sore throat.   Eyes: Negative for blurred vision and pain.  Respiratory: Negative for hemoptysis, shortness of breath and wheezing.   Cardiovascular: Negative for chest pain, palpitations and leg swelling.  Gastrointestinal: Positive for heartburn. Negative for abdominal pain, blood in stool, diarrhea, nausea and vomiting.  Genitourinary: Negative for dysuria, frequency, hematuria and urgency.  Musculoskeletal: Negative for back pain, joint pain and myalgias.  Skin: Negative for itching and rash.  Neurological: Positive for headaches. Negative for dizziness and tingling.  Endo/Heme/Allergies: Negative for environmental allergies and polydipsia. Does not bruise/bleed easily.       Negative for hirsutism   Psychiatric/Behavioral: Negative for depression. The patient is nervous/anxious. The patient does not have insomnia.     Past Medical History:  Past Medical History:  Diagnosis Date  . Allergic rhinitis   . Anemia   . Hypercholesterolemia   . Migraine   . Osteopenia   . Vocal cord polyp     Past Surgical History:  Past Surgical History:  Procedure Laterality Date  . COLONOSCOPY  2015   negative  . excision of  polyps from vocal cords  1970, 1984, 1985    Family History:  Family History  Problem Relation Age of Onset  . Diabetes Mother   . Colon polyps Mother 43  . Heart disease Mother        MI has pacemaker  . Hypertension Mother   . Diabetes Father   . Skin cancer Maternal Aunt   . Squamous cell carcinoma Sister        skin on arm  . Breast cancer Neg Hx     Social History:  Social History   Socioeconomic History  . Marital status: Married    Spouse name: Not on  file  . Number of children: 1  . Years of education: Not on file  . Highest education level: Not on file  Occupational History  . Occupation: Environmental health practitioner, Passenger transport manager  Tobacco Use  . Smoking status: Never Smoker  . Smokeless tobacco: Never Used  Substance and Sexual Activity  . Alcohol use: Yes    Comment: 1/week  . Drug use: No  . Sexual activity: Yes    Birth control/protection: Post-menopausal  Other Topics Concern  . Not on file  Social History Narrative   Daughter graduated from [employer] 2020 thea(took theater and communications).   Social Determinants of Health   Financial Resource Strain:   . Difficulty of Paying Living Expenses: Not on file  Food Insecurity:   . Worried About Programme researcher, broadcasting/film/video in the Last Year: Not on file  . Ran Out of Food in the Last Year: Not on file  Transportation Needs:   . Lack of Transportation (Medical): Not on file  . Lack of Transportation (Non-Medical): Not on file  Physical Activity:   . Days of Exercise per Week: Not on file  . Minutes of Exercise per Session: Not on file  Stress:   . Feeling of Stress : Not on file  Social Connections:   . Frequency of Communication with Friends and Family: Not on file  . Frequency of Social Gatherings with Friends and Family: Not on file  . Attends Religious Services: Not on file  . Active Member of Clubs or Organizations: Not on file  . Attends Banker Meetings: Not on file  . Marital Status: Not on file  Intimate Partner Violence:   . Fear of Current or Ex-Partner: Not on file  . Emotionally Abused: Not on file  . Physically Abused: Not on file  . Sexually Abused: Not on file    Allergies:  Allergies  Allergen Reactions  . Statins Other (See Comments)    Myopathy     Medications:  Current Outpatient Medications on File Prior to Visit  Medication Sig Dispense Refill  . B Complex Vitamins (VITAMIN-B COMPLEX) TABS Take by mouth.    . Calcium  Carbonate-Vitamin D3 (CALCIUM 600-D) 600-400 MG-UNIT TABS Take by mouth.    . cetirizine (ZYRTEC) 10 MG tablet Take by mouth.    Marland Kitchen FLUZONE QUADRIVALENT 0.5 ML injection inject 0.5 milliliter intramuscularly  0  . Misc Natural Products (PETADOLEX 75) 75 MG CAPS Take by mouth.    . Multiple Vitamin (MULTIVITAMIN) tablet Take by mouth.    . progesterone (PROMETRIUM) 100 MG capsule Take 1 capsule (100 mg total) by mouth daily. 90 capsule 0  . sertraline (ZOLOFT) 100 MG tablet     . Ubrogepant (UBRELVY) 100 MG TABS Take 1 tablet by mouth as needed.     No current facility-administered medications on file  prior to visit.   Physical Exam Vitals: BP 120/90   Pulse 84   Temp (!) 96.2 F (35.7 C)   Ht 5\' 5"  (1.651 m)   Wt 197 lb (89.4 kg)   BMI 32.78 kg/m   General: WF in  NAD HEENT: normocephalic, anicteric Neck: no thyroid enlargement, no palpable nodules, no cervical lymphadenopathy  Pulmonary: No increased work of breathing, CTAB Cardiovascular: RRR, without murmur  Breast: Breast symmetrical, no tenderness, no palpable nodules or masses, no skin or nipple retraction present, no nipple discharge.  No axillary, infraclavicular or supraclavicular lymphadenopathy. Abdomen: Soft, non-tender, non-distended.  Umbilicus without lesions.  No hepatomegaly or masses palpable. No evidence of hernia. Genitourinary:  External: Normal external female genitalia.  Normal urethral meatus, normal Bartholin's and Skene's glands.    Vagina: Normal vaginal mucosa, no evidence of prolapse.    Cervix: Grossly normal in appearance, no bleeding, non-tender  Uterus: Retroverted, normal size, shape, and consistency, mobile, and non-tender  Adnexa: No adnexal masses, non-tender  Rectal: deferred  Lymphatic: no evidence of inguinal lymphadenopathy Extremities: no edema, erythema, or tenderness Neurologic: Grossly intact Psychiatric: mood appropriate, affect full     Assessment: 59 y.o. 46 well woman  exam Postmenopausal -on Hormone therapy History of hyperlipidemia Right breast asymmetry on mammogram  Plan:   1) Breast cancer screening -  Diagnostic mammogram and right breast ultrasound was ordered today.  2) Colon cancer screening: Cologuard 2020. Next screening due 2023  3) Cervical cancer screening - Pap was done.  4) Fasting lipid panel  5) Osteopenia: consider getting another DEXA at age 7.  40) RTO 1 year for annual and prn.   5, CNM

## 2019-02-12 ENCOUNTER — Ambulatory Visit (INDEPENDENT_AMBULATORY_CARE_PROVIDER_SITE_OTHER): Payer: 59 | Admitting: Certified Nurse Midwife

## 2019-02-12 ENCOUNTER — Other Ambulatory Visit: Payer: Self-pay

## 2019-02-12 ENCOUNTER — Other Ambulatory Visit (HOSPITAL_COMMUNITY)
Admission: RE | Admit: 2019-02-12 | Discharge: 2019-02-12 | Disposition: A | Discharge status: Home / Self Care | Payer: 59 | Source: Ambulatory Visit | Attending: Certified Nurse Midwife | Admitting: Certified Nurse Midwife

## 2019-02-12 ENCOUNTER — Encounter: Payer: Self-pay | Admitting: Certified Nurse Midwife

## 2019-02-12 VITALS — BP 120/90 | HR 84 | Temp 96.2°F | Ht 65.0 in | Wt 197.0 lb

## 2019-02-12 DIAGNOSIS — N6489 Other specified disorders of breast: Secondary | ICD-10-CM

## 2019-02-12 DIAGNOSIS — Z01419 Encounter for gynecological examination (general) (routine) without abnormal findings: Secondary | ICD-10-CM

## 2019-02-12 DIAGNOSIS — Z124 Encounter for screening for malignant neoplasm of cervix: Secondary | ICD-10-CM | POA: Insufficient documentation

## 2019-02-12 DIAGNOSIS — Z1231 Encounter for screening mammogram for malignant neoplasm of breast: Secondary | ICD-10-CM

## 2019-02-12 DIAGNOSIS — Z79890 Hormone replacement therapy (postmenopausal): Secondary | ICD-10-CM

## 2019-02-12 DIAGNOSIS — E7800 Pure hypercholesterolemia, unspecified: Secondary | ICD-10-CM

## 2019-02-12 MED ORDER — ELESTRIN 0.52 MG/0.87 GM (0.06%) TD GEL: TRANSDERMAL | 2 refills | Status: DC

## 2019-02-13 ENCOUNTER — Telehealth: Payer: Self-pay | Admitting: Certified Nurse Midwife

## 2019-02-13 NOTE — Telephone Encounter (Signed)
Patient aware of her time and date at [facility] on 03/19/2019 @ 2:20pm

## 2019-02-14 LAB — CYTOLOGY - PAP
Comment: NEGATIVE
Diagnosis: NEGATIVE
High risk HPV: NEGATIVE

## 2019-03-14 ENCOUNTER — Telehealth: Payer: Self-pay | Admitting: *Deleted

## 2019-03-14 NOTE — Telephone Encounter (Signed)
Patient calling to cancel Covid 19 vaccine appointment for 03/17/19. No appointment on file.

## 2019-03-19 ENCOUNTER — Other Ambulatory Visit: Payer: 59

## 2019-05-07 ENCOUNTER — Other Ambulatory Visit: Payer: Self-pay | Admitting: Certified Nurse Midwife

## 2019-06-05 ENCOUNTER — Ambulatory Visit
Admission: RE | Admit: 2019-06-05 | Discharge: 2019-06-05 | Disposition: A | Discharge status: Home / Self Care | Payer: 59 | Source: Ambulatory Visit | Attending: Certified Nurse Midwife | Admitting: Certified Nurse Midwife

## 2019-06-05 DIAGNOSIS — Z1231 Encounter for screening mammogram for malignant neoplasm of breast: Secondary | ICD-10-CM

## 2019-06-05 DIAGNOSIS — N6489 Other specified disorders of breast: Secondary | ICD-10-CM | POA: Insufficient documentation

## 2019-07-19 ENCOUNTER — Other Ambulatory Visit: Payer: Self-pay | Admitting: Certified Nurse Midwife

## 2019-07-26 ENCOUNTER — Other Ambulatory Visit: Payer: Self-pay

## 2019-07-26 ENCOUNTER — Ambulatory Visit (INDEPENDENT_AMBULATORY_CARE_PROVIDER_SITE_OTHER): Payer: Commercial Managed Care - PPO | Admitting: Nurse Practitioner

## 2019-07-26 ENCOUNTER — Encounter: Payer: Self-pay | Admitting: Nurse Practitioner

## 2019-07-26 VITALS — BP 130/78 | HR 71 | Temp 98.3°F | Ht 64.57 in | Wt 192.6 lb

## 2019-07-26 DIAGNOSIS — E6609 Other obesity due to excess calories: Secondary | ICD-10-CM

## 2019-07-26 DIAGNOSIS — Z1159 Encounter for screening for other viral diseases: Secondary | ICD-10-CM

## 2019-07-26 DIAGNOSIS — Z114 Encounter for screening for human immunodeficiency virus [HIV]: Secondary | ICD-10-CM

## 2019-07-26 DIAGNOSIS — Z862 Personal history of diseases of the blood and blood-forming organs and certain disorders involving the immune mechanism: Secondary | ICD-10-CM | POA: Insufficient documentation

## 2019-07-26 DIAGNOSIS — F320 Major depressive disorder, single episode, mild: Secondary | ICD-10-CM | POA: Diagnosis not present

## 2019-07-26 DIAGNOSIS — Z7689 Persons encountering health services in other specified circumstances: Secondary | ICD-10-CM

## 2019-07-26 DIAGNOSIS — R7989 Other specified abnormal findings of blood chemistry: Secondary | ICD-10-CM | POA: Diagnosis not present

## 2019-07-26 DIAGNOSIS — G43119 Migraine with aura, intractable, without status migrainosus: Secondary | ICD-10-CM

## 2019-07-26 DIAGNOSIS — E7800 Pure hypercholesterolemia, unspecified: Secondary | ICD-10-CM | POA: Diagnosis not present

## 2019-07-26 DIAGNOSIS — Z6832 Body mass index (BMI) 32.0-32.9, adult: Secondary | ICD-10-CM

## 2019-07-26 DIAGNOSIS — M79622 Pain in left upper arm: Secondary | ICD-10-CM | POA: Insufficient documentation

## 2019-07-26 DIAGNOSIS — J301 Allergic rhinitis due to pollen: Secondary | ICD-10-CM

## 2019-07-26 DIAGNOSIS — E669 Obesity, unspecified: Secondary | ICD-10-CM | POA: Insufficient documentation

## 2019-07-26 DIAGNOSIS — M85851 Other specified disorders of bone density and structure, right thigh: Secondary | ICD-10-CM

## 2019-07-26 NOTE — Assessment & Plan Note (Signed)
Chronic, ongoing.  Followed by neurology, continue this collaboration and current medication regimen as prescribed by them.  Return in 6 months. 

## 2019-07-26 NOTE — Assessment & Plan Note (Signed)
Reports history of high levels, check lipid panel and CMP today.  Initiate medication as needed based on ASCVD and levels on labs.

## 2019-07-26 NOTE — Assessment & Plan Note (Signed)
Acute since Covid vaccine #2 to location.  No red flag symptoms reported or findings on exam.  Continue to monitor and if ongoing will obtain imaging.

## 2019-07-26 NOTE — Assessment & Plan Note (Signed)
Chronic, ongoing.  Was started on Sertraline 100 MG by neurology some time back.  Continue this regimen and adjust as needed.  Stable on regimen at this time.  Denies SI/HI.

## 2019-07-26 NOTE — Assessment & Plan Note (Signed)
Reports elevation in TSH at recent bariatric clinic visit, will check TSH and Free T4 today.  Initiate medication as needed.

## 2019-07-26 NOTE — Progress Notes (Signed)
New Patient Office Visit  Subjective:  Patient ID: Margaret Fuller, female    DOB: 04/23/1960  Age: 59 y.o. MRN: [id number]  CC:  Chief Complaint  Patient presents with  . Establish Care  . Thyroid Problem  . tender spot in arm  . Constipation    HPI Margaret Fuller presents for new patient visit to establish care.  Introduced to Publishing rights manager role and practice setting.  All questions answered.  Discussed provider/patient relationship and expectations.  Has not had a PCP in awhile.  Goes to GYN for annual exams and is currently using Estradiol gel and Prometrium for menopausal symptoms.  HYPOTHYROIDISM Was told last week at bariatric clinic in [facility] was told TSH was 4.6.  Has not had issues with thyroid before.  Currently no medications for weight loss -- but has started multivitamin shots.  Has struggled with constipation for years, reports even shows up in colonoscopies does not "clean out well".  Occasionally strains to have BM.  Did change diet recently.  Has not had a BM this week, last was Thursday last week.  Averages 1-2 BM a week.  Denies hemorrhoids, blood in stool.  Has occasional abdominal pain.  No recent OTC medication use for bowels.   Fatigue: occasional Cold intolerance: yes Heat intolerance: no Weight gain: yes Weight loss: no Constipation: yes -- has been ongoing for years Diarrhea/loose stools: no Palpitations: no Lower extremity edema: no Anxiety/depressed mood: has underlying   DEPRESSION Has taken Zoloft 100 MG for two years, which works well.  Prescribed by neurology.   Mood status: stable Satisfied with current treatment?: yes Symptom severity: moderate  Duration of current treatment : chronic Side effects: no Medication compliance: good compliance Psychotherapy/counseling: none Depressed mood: occasional Anxious mood: occasional Anhedonia: no Significant weight loss or gain: no Insomnia: none Fatigue:  no Feelings of worthlessness or guilt: no Impaired concentration/indecisiveness: no Suicidal ideations: no Hopelessness: no Crying spells: no Depression screen [facility] 2/9 07/26/2019  Decreased Interest 0  Down, Depressed, Hopeless 0  PHQ - 2 Score 0  Altered sleeping 0  Tired, decreased energy 0  Change in appetite 0  Feeling bad or failure about yourself  0  Trouble concentrating 0  Moving slowly or fidgety/restless 0  Suicidal thoughts 0  PHQ-9 Score 0  Difficult doing work/chores Not difficult at all   GAD 7 : Generalized Anxiety Score 07/26/2019  Nervous, Anxious, on Edge 0  Control/stop worrying 0  Worry too much - different things 0  Trouble relaxing 0  Restless 0  Easily annoyed or irritable 1  Afraid - awful might happen 0  Total GAD 7 Score 1  Anxiety Difficulty Not difficult at all    MIGRAINES Has been on current migraine medication for 6-7 years.  Last saw them at [facility] 01/03/19.  Margaret Fuller and Imitrex as needed.   Headache status at time of visit: asymptomatic Treatments attempted: Treatments attempted: multiple medications Aura: yes Nausea:  yes Vomiting: no Photophobia:  yes Phonophobia:  yes Effect on social functioning:  no Numbers of missed days of school/work each month: none Confusion:  no Gait disturbance/ataxia:  no Behavioral changes:  no Fevers:  no  TENDER SPOT UNDER LEFT ARM Been present for 2 months under her left arm, started about time she got second Covid vaccine.  She received second Covid vaccine in left arm on March 26th.  Does not hurt all the time.  Notices it 4-5 times a week -- tends  to be later in day.  Denies any effect on daily function. Duration: months Location: under left arm Severity: not painful, feels like something stuck Redness: no Swelling: no Oozing: no Pus: no Fevers: no Nausea/vomiting: no Status: stable Treatments attempted:nothing  Past Medical History:  Diagnosis Date  . Allergic rhinitis    . Anemia   . Hypercholesterolemia   . Migraine   . Osteopenia   . Vocal cord polyp     Past Surgical History:  Procedure Laterality Date  . COLONOSCOPY  2015   negative  . excision of polyps from vocal cords  1970, 1984, 1985    Family History  Problem Relation Age of Onset  . Diabetes Mother   . Colon polyps Mother [id number]  . Heart disease Mother        MI has pacemaker  . Hypertension Mother   . Diabetes Father   . Skin cancer Maternal Aunt   . Squamous cell carcinoma Sister        skin on arm  . Breast cancer Neg Hx     Social History   Socioeconomic History  . Marital status: Married    Spouse name: Not on file  . Number of children: 1  . Years of education: Not on file  . Highest education level: Not on file  Occupational History  . Occupation: Environmental health practitionerAdministrative Assistant, Passenger transport manageregistrar  Tobacco Use  . Smoking status: Never Smoker  . Smokeless tobacco: Never Used  Vaping Use  . Vaping Use: Never used  Substance and Sexual Activity  . Alcohol use: Yes    Comment: 1/week  . Drug use: No  . Sexual activity: Yes    Birth control/protection: Post-menopausal  Other Topics Concern  . Not on file  Social History Narrative   Daughter graduated from [employer] 2020 thea(took theater and communications).   Social Determinants of Health   Financial Resource Strain:   . Difficulty of Paying Living Expenses:   Food Insecurity:   . Worried About Programme researcher, broadcasting/film/videounning Out of Food in the Last Year:   . Baristaan Out of Food in the Last Year:   Transportation Needs:   . Freight forwarderLack of Transportation (Medical):   Marland Kitchen. Lack of Transportation (Non-Medical):   Physical Activity:   . Days of Exercise per Week:   . Minutes of Exercise per Session:   Stress:   . Feeling of Stress :   Social Connections:   . Frequency of Communication with Friends and Family:   . Frequency of Social Gatherings with Friends and Family:   . Attends Religious Services:   . Active Member of Clubs or Organizations:   .  Attends BankerClub or Organization Meetings:   Marland Kitchen. Marital Status:   Intimate Partner Violence:   . Fear of Current or Ex-Partner:   . Emotionally Abused:   Marland Kitchen. Physically Abused:   . Sexually Abused:     ROS Review of Systems  Constitutional: Positive for fatigue (occasional). Negative for activity change, appetite change, diaphoresis and fever.  Respiratory: Negative for cough, chest tightness and shortness of breath.   Cardiovascular: Negative for chest pain, palpitations and leg swelling.  Gastrointestinal: Positive for constipation. Negative for abdominal distention, abdominal pain, diarrhea, nausea and vomiting.  Endocrine: Positive for cold intolerance. Negative for heat intolerance, polydipsia, polyphagia and polyuria.  Neurological: Negative for dizziness, syncope, weakness, light-headedness, numbness and headaches.  Psychiatric/Behavioral: Negative for decreased concentration, self-injury, sleep disturbance and suicidal ideas. The patient is not nervous/anxious.     Objective:  Today's Vitals: BP 130/78   Pulse 71   Temp 98.3 F (36.8 C) (Oral)   Ht 5' 4.57" (1.64 m)   Wt 192 lb 9.6 oz (87.4 kg)   SpO2 97%   BMI 32.48 kg/m   Physical Exam Vitals and nursing note reviewed.  Constitutional:      General: She is awake. She is not in acute distress.    Appearance: She is well-developed and well-groomed. She is obese. She is not ill-appearing.  HENT:     Head: Normocephalic.     Right Ear: Hearing normal.     Left Ear: Hearing normal.  Eyes:     General: Lids are normal.        Right eye: No discharge.        Left eye: No discharge.     Conjunctiva/sclera: Conjunctivae normal.     Pupils: Pupils are equal, round, and reactive to light.  Neck:     Thyroid: No thyromegaly.     Vascular: No carotid bruit.  Cardiovascular:     Rate and Rhythm: Normal rate and regular rhythm.     Heart sounds: Normal heart sounds. No murmur heard.  No gallop.   Pulmonary:     Effort:  Pulmonary effort is normal. No accessory muscle usage or respiratory distress.     Breath sounds: Normal breath sounds.  Abdominal:     General: Bowel sounds are normal.     Palpations: Abdomen is soft.  Musculoskeletal:     Right upper arm: Normal.     Left upper arm: Normal.     Cervical back: Normal range of motion and neck supple.     Right lower leg: No edema.     Left lower leg: No edema.     Comments: No edema, erythema, or pain with ROM noted under left arm.  No lymphadenopathy noted.  Skin intact with no rash.  Lymphadenopathy:     Head:     Right side of head: No submental, submandibular, tonsillar, preauricular or posterior auricular adenopathy.     Left side of head: No submental, submandibular, tonsillar, preauricular or posterior auricular adenopathy.     Cervical: No cervical adenopathy.     Upper Body:     Right upper body: No axillary adenopathy.     Left upper body: No axillary adenopathy.  Skin:    General: Skin is warm and dry.  Neurological:     Mental Status: She is alert and oriented to person, place, and time.  Psychiatric:        Attention and Perception: Attention normal.        Mood and Affect: Mood normal.        Speech: Speech normal.        Behavior: Behavior normal. Behavior is cooperative.        Thought Content: Thought content normal.     Assessment & Plan:   Problem List Items Addressed This Visit      Cardiovascular and Mediastinum   Intractable migraine with aura without status migrainosus    Chronic, ongoing.  Followed by neurology, continue this collaboration and current medication regimen as prescribed by them.  Return in 6 months.      Relevant Medications   SUMAtriptan (IMITREX) 100 MG tablet     Respiratory   Allergic rhinitis    Chronic, ongoing.  Continue OTC Zyrtec and adjust regimen as needed.          Musculoskeletal and Integument  Osteopenia of neck of right femur    Noted on DEXA in 2017 with T -1.6.  Continue  daily supplements, Calcium and Vitamin D.  Plan to repeat DEXA in 2022.  Check Vit D level today.      Relevant Orders   VITAMIN D 25 Hydroxy (Vit-D Deficiency, Fractures)     Other   Hypercholesterolemia    Reports history of high levels, check lipid panel and CMP today.  Initiate medication as needed based on ASCVD and levels on labs.        Relevant Orders   Comprehensive metabolic panel   Lipid Panel w/o Chol/HDL Ratio   History of anemia    Reports history of anemia, check CBC today.      Relevant Orders   CBC with Differential/Platelet   Elevated TSH    Reports elevation in TSH at recent bariatric clinic visit, will check TSH and Free T4 today.  Initiate medication as needed.      Relevant Orders   TSH   T4   Depression, major, single episode, mild (HCC)    Chronic, ongoing.  Was started on Sertraline 100 MG by neurology some time back.  Continue this regimen and adjust as needed.  Stable on regimen at this time.  Denies SI/HI.      Obesity    BMI today 32.48.  Continue to collaborate with bariatric clinic in [place].  Recommended eating smaller high protein, low fat meals more frequently and exercising 30 mins a day 5 times a week with a goal of 10-15lb weight loss in the next 3 months. Patient voiced their understanding and motivation to adhere to these recommendations.       Axillary tenderness, left    Acute since Covid vaccine #2 to location.  No red flag symptoms reported or findings on exam.  Continue to monitor and if ongoing will obtain imaging.       Other Visit Diagnoses    Encounter to establish care    -  Primary   Encounter for screening for HIV       HIV screening on labs today.   Relevant Orders   HIV Antibody (routine testing w rflx)   Need for hepatitis C screening test       Hep C screening on labs today.   Relevant Orders   Hepatitis C antibody      Outpatient Encounter Medications as of 07/26/2019  Medication Sig  . B Complex Vitamins  (VITAMIN-B COMPLEX) TABS Take by mouth.  . Calcium Carbonate-Vitamin D3 (CALCIUM 600-D) 600-400 MG-UNIT TABS Take by mouth.  . cetirizine (ZYRTEC) 10 MG tablet Take by mouth.  . Estradiol (ELESTRIN) 0.52 MG/0.87 GM (0.06%) GEL APPLY ONE PUMP TO UPPER ARM AREA EVERY DAY  . Misc Natural Products (PETADOLEX 75) 75 MG CAPS Take by mouth.  . Multiple Vitamin (MULTIVITAMIN) tablet Take by mouth.  . progesterone (PROMETRIUM) 100 MG capsule TAKE 1 CAPSULE BY MOUTH DAILY  . sertraline (ZOLOFT) 100 MG tablet   . SUMAtriptan (IMITREX) 100 MG tablet Take 1 tablet by mouth as needed.  Marland Kitchen Ubrogepant (UBRELVY) 100 MG TABS Take 1 tablet by mouth as needed.  . [DISCONTINUED] FLUZONE QUADRIVALENT 0.5 ML injection inject 0.5 milliliter intramuscularly   No facility-administered encounter medications on file as of 07/26/2019.    Follow-up: Return in about 6 months (around 01/26/2020) for Follow-up visit  -- migraines, mood, HLD, thyroid.   Marjie Skiff, NP

## 2019-07-26 NOTE — Assessment & Plan Note (Signed)
Chronic, ongoing.  Continue OTC Zyrtec and adjust regimen as needed.

## 2019-07-26 NOTE — Assessment & Plan Note (Signed)
BMI today 32.48.  Continue to collaborate with bariatric clinic in [place].  Recommended eating smaller high protein, low fat meals more frequently and exercising 30 mins a day 5 times a week with a goal of 10-15lb weight loss in the next 3 months. Patient voiced their understanding and motivation to adhere to these recommendations.

## 2019-07-26 NOTE — Assessment & Plan Note (Signed)
Noted on DEXA in 2017 with T -1.6.  Continue daily supplements, Calcium and Vitamin D.  Plan to repeat DEXA in 2022.  Check Vit D level today.

## 2019-07-26 NOTE — Assessment & Plan Note (Signed)
Reports history of anemia, check CBC today. 

## 2019-07-26 NOTE — Patient Instructions (Signed)
Start taking Miralax daily and one Senokot tablet in morning.  Constipation, Adult Constipation is when a person:  Poops (has a bowel movement) fewer times in a week than normal.  Has a hard time pooping.  Has poop that is dry, hard, or bigger than normal. Follow these instructions at home: Eating and drinking   Eat foods that have a lot of fiber, such as: ? Fresh fruits and vegetables. ? Whole grains. ? Beans.  Eat less of foods that are high in fat, low in fiber, or overly processed, such as: ? [place] fries. ? Hamburgers. ? Cookies. ? Candy. ? Soda.  Drink enough fluid to keep your pee (urine) clear or pale yellow. General instructions  Exercise regularly or as told by your doctor.  Go to the restroom when you feel like you need to poop. Do not hold it in.  Take over-the-counter and prescription medicines only as told by your doctor. These include any fiber supplements.  Do pelvic floor retraining exercises, such as: ? Doing deep breathing while relaxing your lower belly (abdomen). ? Relaxing your pelvic floor while pooping.  Watch your condition for any changes.  Keep all follow-up visits as told by your doctor. This is important. Contact a doctor if:  You have pain that gets worse.  You have a fever.  You have not pooped for 4 days.  You throw up (vomit).  You are not hungry.  You lose weight.  You are bleeding from the anus.  You have thin, pencil-like poop (stool). Get help right away if:  You have a fever, and your symptoms suddenly get worse.  You leak poop or have blood in your poop.  Your belly feels hard or bigger than normal (is bloated).  You have very bad belly pain.  You feel dizzy or you faint. This information is not intended to replace advice given to you by your health care provider. Make sure you discuss any questions you have with your health care provider. Document Revised: 12/17/2016 Document Reviewed: 06/25/2015 Elsevier  Patient Education  2020 [employer].

## 2019-07-27 LAB — CBC WITH DIFFERENTIAL/PLATELET
Basophils Absolute: 0.1 10*3/uL (ref 0.0–0.2)
Basos: 2 %
EOS (ABSOLUTE): 0.1 10*3/uL (ref 0.0–0.4)
Eos: 2 %
Hematocrit: 38.6 % (ref 34.0–46.6)
Hemoglobin: 13.1 g/dL (ref 11.1–15.9)
Immature Grans (Abs): 0 10*3/uL (ref 0.0–0.1)
Immature Granulocytes: 0 %
Lymphocytes Absolute: 2 10*3/uL (ref 0.7–3.1)
Lymphs: 34 %
MCH: 32.6 pg (ref 26.6–33.0)
MCHC: 33.9 g/dL (ref 31.5–35.7)
MCV: 96 fL (ref 79–97)
Monocytes Absolute: 0.3 10*3/uL (ref 0.1–0.9)
Monocytes: 6 %
Neutrophils Absolute: 3.2 10*3/uL (ref 1.4–7.0)
Neutrophils: 56 %
Platelets: 316 10*3/uL (ref 150–450)
RBC: 4.02 x10E6/uL (ref 3.77–5.28)
RDW: 11.8 % (ref 11.7–15.4)
WBC: 5.8 10*3/uL (ref 3.4–10.8)

## 2019-07-27 LAB — COMPREHENSIVE METABOLIC PANEL
ALT: 10 IU/L (ref 0–32)
AST: 20 IU/L (ref 0–40)
Albumin/Globulin Ratio: 1.6 (ref 1.2–2.2)
Albumin: 4.6 g/dL (ref 3.8–4.9)
Alkaline Phosphatase: 68 IU/L (ref 48–121)
BUN/Creatinine Ratio: 22 (ref 9–23)
BUN: 14 mg/dL (ref 6–24)
Bilirubin Total: 0.4 mg/dL (ref 0.0–1.2)
CO2: 26 mmol/L (ref 20–29)
Calcium: 9.6 mg/dL (ref 8.7–10.2)
Chloride: 103 mmol/L (ref 96–106)
Creatinine, Ser: 0.65 mg/dL (ref 0.57–1.00)
GFR calc Af Amer: 112 mL/min/{1.73_m2} (ref >59)
GFR calc non Af Amer: 97 mL/min/{1.73_m2} (ref >59)
Globulin, Total: 2.9 g/dL (ref 1.5–4.5)
Glucose: 89 mg/dL (ref 65–99)
Potassium: 4.7 mmol/L (ref 3.5–5.2)
Sodium: 143 mmol/L (ref 134–144)
Total Protein: 7.5 g/dL (ref 6.0–8.5)

## 2019-07-27 LAB — T4: T4, Total: 5.1 ug/dL (ref 4.5–12.0)

## 2019-07-27 LAB — TSH: TSH: 4.09 u[IU]/mL (ref 0.450–4.500)

## 2019-07-27 LAB — LIPID PANEL W/O CHOL/HDL RATIO
Cholesterol, Total: 316 mg/dL — ABNORMAL HIGH (ref 100–199)
HDL: 42 mg/dL (ref >39)
LDL Chol Calc (NIH): 233 mg/dL — ABNORMAL HIGH (ref 0–99)
Triglycerides: 204 mg/dL — ABNORMAL HIGH (ref 0–149)
VLDL Cholesterol Cal: 41 mg/dL — ABNORMAL HIGH (ref 5–40)

## 2019-07-27 LAB — HEPATITIS C ANTIBODY: Hep C Virus Ab: <0.1 s/co ratio (ref 0.0–0.9)

## 2019-07-27 LAB — HIV ANTIBODY (ROUTINE TESTING W REFLEX): HIV Screen 4th Generation wRfx: NONREACTIVE

## 2019-07-27 LAB — VITAMIN D 25 HYDROXY (VIT D DEFICIENCY, FRACTURES): Vit D, 25-Hydroxy: 25.6 ng/mL — ABNORMAL LOW (ref 30.0–100.0)

## 2019-07-27 NOTE — Progress Notes (Signed)
Contacted via MyChartGood morning Sherell.  Labs have returned: - Thyroid levels are normal -- TSH and T4 both look good and we will continue to monitor yearly - Hep C and HIV are negative - CBC looks great and kidney and liver function are normal. - Cholesterol levels are quite elevated as we expected.  Due to LDL being >190 I do recommend we start a statin to help lower levels.  Have you taken medication for this before?  A statin would be beneficial to help prevent stroke.  Would you like to try a statin? If not then I would like you to focus heavily on diet, lowering cholesterol rich foods, plus regular exercise and would recheck levels at next visit.  Let me know. Keep being awesome!! Kindest regards, Misbah Hornaday

## 2019-07-30 ENCOUNTER — Other Ambulatory Visit: Payer: Self-pay | Admitting: Nurse Practitioner

## 2019-07-30 MED ORDER — FLUVASTATIN SODIUM 20 MG PO CAPS: 20.0000 mg | ORAL_CAPSULE | Freq: Every day | ORAL | 1 refills | Status: DC

## 2019-07-31 ENCOUNTER — Telehealth: Payer: Self-pay

## 2019-07-31 NOTE — Telephone Encounter (Signed)
PA for Fluvastatin initiated and submitted to Cover My Meds. Key: [id number]

## 2019-08-01 ENCOUNTER — Other Ambulatory Visit: Payer: Self-pay | Admitting: Nurse Practitioner

## 2019-08-01 MED ORDER — ROSUVASTATIN CALCIUM 10 MG PO TABS: 10.0000 mg | ORAL_TABLET | ORAL | 3 refills | Status: DC

## 2019-08-01 NOTE — Telephone Encounter (Signed)
Sent script into pharmacy. 

## 2019-08-01 NOTE — Telephone Encounter (Signed)
Please alert patient -- see if she would like to try Rosuvastatin on a three day week schedule at low dose.

## 2019-08-01 NOTE — Telephone Encounter (Signed)
Called and spoke to patient. She is ok with starting the Rosuvastatin. Confirmed pharmacy with patient.

## 2019-08-01 NOTE — Telephone Encounter (Signed)
Fluvastatin denied by patient's insurance.

## 2019-08-27 ENCOUNTER — Other Ambulatory Visit: Payer: Self-pay

## 2019-08-27 ENCOUNTER — Other Ambulatory Visit: Payer: Commercial Managed Care - PPO

## 2019-08-27 DIAGNOSIS — E7800 Pure hypercholesterolemia, unspecified: Secondary | ICD-10-CM

## 2019-08-28 LAB — LIPID PANEL
Chol/HDL Ratio: 5.3 ratio — ABNORMAL HIGH (ref 0.0–4.4)
Cholesterol, Total: 228 mg/dL — ABNORMAL HIGH (ref 100–199)
HDL: 43 mg/dL (ref >39)
LDL Chol Calc (NIH): 134 mg/dL — ABNORMAL HIGH (ref 0–99)
Triglycerides: 282 mg/dL — ABNORMAL HIGH (ref 0–149)
VLDL Cholesterol Cal: 51 mg/dL — ABNORMAL HIGH (ref 5–40)

## 2019-09-06 ENCOUNTER — Other Ambulatory Visit: Payer: Self-pay | Admitting: Nurse Practitioner

## 2019-09-06 MED ORDER — ROSUVASTATIN CALCIUM 10 MG PO TABS: 10.0000 mg | ORAL_TABLET | ORAL | 3 refills | Status: DC

## 2019-09-06 NOTE — Telephone Encounter (Signed)
Please advise pt

## 2020-01-15 ENCOUNTER — Other Ambulatory Visit: Payer: Self-pay

## 2020-01-15 MED ORDER — PROGESTERONE MICRONIZED 100 MG PO CAPS: 100.0000 mg | ORAL_CAPSULE | Freq: Every day | ORAL | 0 refills | Status: DC

## 2020-01-22 ENCOUNTER — Ambulatory Visit: Payer: Commercial Managed Care - PPO | Admitting: Nurse Practitioner

## 2020-01-22 ENCOUNTER — Encounter: Payer: Self-pay | Admitting: Nurse Practitioner

## 2020-01-22 ENCOUNTER — Other Ambulatory Visit: Payer: Self-pay

## 2020-01-22 VITALS — BP 127/68 | HR 85 | Temp 98.1°F | Ht 64.69 in | Wt 204.4 lb

## 2020-01-22 DIAGNOSIS — R7989 Other specified abnormal findings of blood chemistry: Secondary | ICD-10-CM | POA: Diagnosis not present

## 2020-01-22 DIAGNOSIS — T466X5A Adverse effect of antihyperlipidemic and antiarteriosclerotic drugs, initial encounter: Secondary | ICD-10-CM | POA: Insufficient documentation

## 2020-01-22 DIAGNOSIS — G43119 Migraine with aura, intractable, without status migrainosus: Secondary | ICD-10-CM | POA: Diagnosis not present

## 2020-01-22 DIAGNOSIS — F320 Major depressive disorder, single episode, mild: Secondary | ICD-10-CM | POA: Diagnosis not present

## 2020-01-22 DIAGNOSIS — E7800 Pure hypercholesterolemia, unspecified: Secondary | ICD-10-CM | POA: Diagnosis not present

## 2020-01-22 DIAGNOSIS — E6609 Other obesity due to excess calories: Secondary | ICD-10-CM

## 2020-01-22 DIAGNOSIS — Z6834 Body mass index (BMI) 34.0-34.9, adult: Secondary | ICD-10-CM

## 2020-01-22 DIAGNOSIS — M79675 Pain in left toe(s): Secondary | ICD-10-CM | POA: Insufficient documentation

## 2020-01-22 DIAGNOSIS — M791 Myalgia, unspecified site: Secondary | ICD-10-CM

## 2020-01-22 NOTE — Assessment & Plan Note (Signed)
Has history of poor tolerance statins, but currently tolerating 3 day a week dosing Crestor.  Lipid panel today.  If unable to get to goal with 3 day a week dosing then consider Repatha.    

## 2020-01-22 NOTE — Progress Notes (Signed)
BP 127/68   Pulse 85   Temp 98.1 F (36.7 C) (Oral)   Ht 5' 4.69" (1.643 m)   Wt 204 lb 6.4 oz (92.7 kg)   SpO2 97%   BMI 34.35 kg/m    Subjective:    Patient ID: Margaret Fuller, female    DOB: [birth year], 60 y.o.   MRN: [id number]  HPI: Margaret Fuller is a 60 y.o. female  Chief Complaint  Patient presents with  . Migraine  . Hyperlipidemia  . Mood  . Thyroid    . Base of toes    Left foot at base of the middle three toes, tripped in closet awhile ago.   HISTORY OF ELEVATED TSH Noted at a weight center, recent in July was Free T4 - 5.1 and TSH 4.090. Fatigue: occasional Cold intolerance: yes Heat intolerance: no Weight gain: yes Weight loss: no Constipation: occasional Diarrhea/loose stools: no Palpitations: no Lower extremity edema: no Anxiety/depressed mood: has underlying   HYPERLIPIDEMIA Taking Crestor 10 MG three times a week, has history of poor tolerance to statins.  Has occasional joint twinges with 3 times a week, but not often.   Hyperlipidemia status: good compliance Satisfied with current treatment?  yes Side effects:  no Medication compliance: good compliance Past cholesterol meds: multiple in past Supplements: none Aspirin:  no The 10-year ASCVD risk score Denman George DC Jr., et al., 2013) is: 4.2%   Values used to calculate the score:     Age: 27 years     Sex: Female     Is Non-Hispanic African American: No     Diabetic: No     Tobacco smoker: No     Systolic Blood Pressure: 127 mmHg     Is BP treated: No     HDL Cholesterol: 43 mg/dL     Total Cholesterol: 228 mg/dL Chest pain:  no Coronary artery disease:  no Family history CAD:  no Family history early CAD:  no  DEPRESSION Has taken Zoloft 100 MG for 2 1/2 years, which works well. Prescribed by neurology.   Mood status: stable Satisfied with current treatment?: yes Symptom severity: moderate  Duration of current treatment : chronic Side effects:  no Medication compliance: good compliance Psychotherapy/counseling: none Depressed mood: occasional Anxious mood: occasional Anhedonia: no Significant weight loss or gain: no Insomnia: none Fatigue: no Feelings of worthlessness or guilt: no Impaired concentration/indecisiveness: no Suicidal ideations: no Hopelessness: no Crying spells: no Depression screen [facility] 2/9 01/22/2020 07/26/2019  Decreased Interest 0 0  Down, Depressed, Hopeless 1 0  PHQ - 2 Score 1 0  Altered sleeping 0 0  Tired, decreased energy 0 0  Change in appetite 0 0  Feeling bad or failure about yourself  0 0  Trouble concentrating 0 0  Moving slowly or fidgety/restless 0 0  Suicidal thoughts 0 0  PHQ-9 Score 1 0  Difficult doing work/chores - Not difficult at all   MIGRAINES Has been on current migraine medication for 6-7 years.  Last saw them at [facility] 01/03/19.  Ruel Favors and Imitrex as needed.   Headache status at time of visit: asymptomatic Treatments attempted: Treatments attempted: multiple medications Aura: yes Nausea:  yes Vomiting: no Photophobia:  yes Phonophobia:  yes Effect on social functioning:  no Numbers of missed days of school/work each month: none Confusion:  no Gait disturbance/ataxia:  no Behavioral changes:  no Fevers:  no  TOE PAIN Reports this to base of toes left foot.  She  tripped on item in closet months back.  Was bruised at that time, but bruising improved. Does continue to have mild pain to toes.  She would like uric acid checked as is concerned about gout. Duration: months Involved toe: left, multiple toes Mechanism of injury: hit toes Onset: sudden Severity: mild  Quality: dull, aching and throbbing Frequency: intermittent Radiation: no Aggravating factors: walking for extended periods Alleviating factors: rest Status: stable Treatments attempted: nothing  Relief with NSAIDs?: No NSAIDs Taken Morning stiffness: no Redness: no  Bruising:  no Swelling: mild Paresthesias / decreased sensation: no Fevers: no  Relevant past medical, surgical, family and social history reviewed and updated as indicated. Interim medical history since our last visit reviewed. Allergies and medications reviewed and updated.  Review of Systems  Constitutional: Negative for activity change, appetite change, diaphoresis, fatigue and fever.  Respiratory: Negative for cough, chest tightness and shortness of breath.   Cardiovascular: Negative for chest pain, palpitations and leg swelling.  Gastrointestinal: Negative.   Endocrine: Negative for cold intolerance, heat intolerance, polydipsia, polyphagia and polyuria.  Musculoskeletal: Positive for arthralgias.  Neurological: Negative.   Psychiatric/Behavioral: Negative for decreased concentration, self-injury, sleep disturbance and suicidal ideas. The patient is not nervous/anxious.     Per HPI unless specifically indicated above     Objective:    BP 127/68   Pulse 85   Temp 98.1 F (36.7 C) (Oral)   Ht 5' 4.69" (1.643 m)   Wt 204 lb 6.4 oz (92.7 kg)   SpO2 97%   BMI 34.35 kg/m   Wt Readings from Last 3 Encounters:  01/22/20 204 lb 6.4 oz (92.7 kg)  07/26/19 192 lb 9.6 oz (87.4 kg)  02/12/19 197 lb (89.4 kg)    Physical Exam Vitals and nursing note reviewed.  Constitutional:      General: She is awake. She is not in acute distress.    Appearance: She is well-developed and well-groomed. She is obese. She is not ill-appearing or toxic-appearing.  HENT:     Head: Normocephalic.     Right Ear: Hearing normal.     Left Ear: Hearing normal.     Nose: Nose normal.     Mouth/Throat:     Mouth: Mucous membranes are moist.  Eyes:     General: Lids are normal.        Right eye: No discharge.        Left eye: No discharge.     Conjunctiva/sclera: Conjunctivae normal.     Pupils: Pupils are equal, round, and reactive to light.  Neck:     Thyroid: No thyromegaly.     Vascular: No carotid  bruit or JVD.  Cardiovascular:     Rate and Rhythm: Normal rate and regular rhythm.     Pulses:          Dorsalis pedis pulses are 2+ on the right side and 2+ on the left side.       Posterior tibial pulses are 2+ on the right side and 2+ on the left side.     Heart sounds: Normal heart sounds. No murmur heard. No gallop.   Pulmonary:     Effort: Pulmonary effort is normal. No accessory muscle usage or respiratory distress.     Breath sounds: Normal breath sounds.  Abdominal:     General: Bowel sounds are normal.     Palpations: Abdomen is soft. There is no hepatomegaly or splenomegaly.  Musculoskeletal:     Cervical back: Normal range of  motion and neck supple.     Right lower leg: No edema.     Left lower leg: No edema.  Feet:     Right foot:     Protective Sensation: 10 sites tested. 10 sites sensed.     Skin integrity: No erythema or warmth.     Toenail Condition: Right toenails are normal.     Left foot:     Protective Sensation: 10 sites tested. 10 sites sensed.     Skin integrity: No erythema or warmth.     Toenail Condition: Left toenails are normal.     Comments: Mild tenderness to palpation of anterior, dorsal aspect left foot along toe line.  No edema, erythema, warmth. Lymphadenopathy:     Cervical: No cervical adenopathy.  Skin:    General: Skin is warm and dry.  Neurological:     Mental Status: She is alert and oriented to person, place, and time.  Psychiatric:        Attention and Perception: Attention normal.        Mood and Affect: Mood normal.        Speech: Speech normal.        Behavior: Behavior normal. Behavior is cooperative.        Thought Content: Thought content normal.    Results for orders placed or performed in visit on 08/27/19  Lipid panel  Result Value Ref Range   Cholesterol, Total 228 (H) 100 - 199 mg/dL   Triglycerides 809 (H) 0 - 149 mg/dL   HDL 43 >98 mg/dL   VLDL Cholesterol Cal 51 (H) 5 - 40 mg/dL   LDL Chol Calc (NIH) 338 (H) 0  - 99 mg/dL   Chol/HDL Ratio 5.3 (H) 0.0 - 4.4 ratio      Assessment & Plan:   Problem List Items Addressed This Visit      Cardiovascular and Mediastinum   Intractable migraine with aura without status migrainosus    Chronic, ongoing.  Followed by neurology, continue this collaboration and current medication regimen as prescribed by them.  Return in 6 months.        Other   Hypercholesterolemia    Chronic, ongoing.  Continue current medication regimen and adjust as needed.  Has history of poor tolerance statins, but currently tolerating 3 day a week dosing.  Lipid panel today.  If unable to get to goal with 3 day a week dosing then consider Repatha.         Relevant Orders   Basic metabolic panel   Lipid Panel w/o Chol/HDL Ratio   Elevated TSH    Recent levels stable, will recheck today.  Initiate medication as needed.      Relevant Orders   TSH   T4, free   Depression, major, single episode, mild (HCC) - Primary    Chronic, ongoing.  Start on Sertraline by neurology months back.  Continue this regimen and adjust as needed.  Stable on regimen at this time.  Denies SI/HI.  Return in 6 months.      Obesity    BMI 34.35.  Recommended eating smaller high protein, low fat meals more frequently and exercising 30 mins a day 5 times a week with a goal of 10-15lb weight loss in the next 3 months. Patient voiced their understanding and motivation to adhere to these recommendations.       Myalgia due to statin    Has history of poor tolerance statins, but currently tolerating 3 day a  week dosing Crestor.  Lipid panel today.  If unable to get to goal with 3 day a week dosing then consider Repatha.         Pain of toe of left foot    Ongoing since stubbed toes months back.  Possibly had fracture months back.  Will defer imaging at this time, which patient would prefer.  Suspect some arthritic changes.  Recommend OTC Voltaren gel and ensuring good orthotic support.  May take Tylenol as  needed for pain.  Check uric acid level today.  If ongoing consider imaging and podiatry referral, although patient has history of foot injections and would prefer not to do this again.      Relevant Orders   Uric acid       Follow up plan: Return in about 6 months (around 07/21/2020) for Annual physical.

## 2020-01-22 NOTE — Assessment & Plan Note (Signed)
Recent levels stable, will recheck today.  Initiate medication as needed. 

## 2020-01-22 NOTE — Assessment & Plan Note (Signed)
Chronic, ongoing.  Continue current medication regimen and adjust as needed.  Has history of poor tolerance statins, but currently tolerating 3 day a week dosing.  Lipid panel today.  If unable to get to goal with 3 day a week dosing then consider Repatha.

## 2020-01-22 NOTE — Assessment & Plan Note (Signed)
BMI 34.35. Recommended eating smaller high protein, low fat meals more frequently and exercising 30 mins a day 5 times a week with a goal of 10-15lb weight loss in the next 3 months. Patient voiced their understanding and motivation to adhere to these recommendations.   

## 2020-01-22 NOTE — Assessment & Plan Note (Signed)
Ongoing since stubbed toes months back.  Possibly had fracture months back.  Will defer imaging at this time, which patient would prefer.  Suspect some arthritic changes.  Recommend OTC Voltaren gel and ensuring good orthotic support.  May take Tylenol as needed for pain.  Check uric acid level today.  If ongoing consider imaging and podiatry referral, although patient has history of foot injections and would prefer not to do this again.

## 2020-01-22 NOTE — Assessment & Plan Note (Signed)
Chronic, ongoing.  Followed by neurology, continue this collaboration and current medication regimen as prescribed by them.  Return in 6 months. 

## 2020-01-22 NOTE — Patient Instructions (Signed)

## 2020-01-22 NOTE — Assessment & Plan Note (Signed)
Chronic, ongoing.  Start on Sertraline by neurology months back.  Continue this regimen and adjust as needed.  Stable on regimen at this time.  Denies SI/HI.  Return in 6 months.

## 2020-01-23 LAB — T4, FREE: Free T4: 0.9 ng/dL (ref 0.82–1.77)

## 2020-01-23 LAB — LIPID PANEL W/O CHOL/HDL RATIO
Cholesterol, Total: 279 mg/dL — ABNORMAL HIGH (ref 100–199)
HDL: 54 mg/dL (ref >39)
LDL Chol Calc (NIH): 197 mg/dL — ABNORMAL HIGH (ref 0–99)
Triglycerides: 154 mg/dL — ABNORMAL HIGH (ref 0–149)
VLDL Cholesterol Cal: 28 mg/dL (ref 5–40)

## 2020-01-23 LAB — BASIC METABOLIC PANEL
BUN/Creatinine Ratio: 21 (ref 12–28)
BUN: 16 mg/dL (ref 8–27)
CO2: 23 mmol/L (ref 20–29)
Calcium: 9.1 mg/dL (ref 8.7–10.3)
Chloride: 103 mmol/L (ref 96–106)
Creatinine, Ser: 0.75 mg/dL (ref 0.57–1.00)
GFR calc Af Amer: 100 mL/min/{1.73_m2} (ref >59)
GFR calc non Af Amer: 87 mL/min/{1.73_m2} (ref >59)
Glucose: 94 mg/dL (ref 65–99)
Potassium: 4.2 mmol/L (ref 3.5–5.2)
Sodium: 141 mmol/L (ref 134–144)

## 2020-01-23 LAB — TSH: TSH: 2.97 u[IU]/mL (ref 0.450–4.500)

## 2020-01-23 LAB — URIC ACID: Uric Acid: 4.8 mg/dL (ref 3.0–7.2)

## 2020-01-23 NOTE — Progress Notes (Signed)
Contacted via MyChart   Good morning Ms. Dorothey Baseman, your labs have returned.  Kidney function remains stable.  Thyroid and uric acid levels are normal.  Cholesterol levels remain above goal.  I do think we have tried all routes to help lower and with your history I do not want to try increasing dose as this I feel would lead to side effects.  I would like you to reach out to insurance company and discuss scenario, see if Repatha would be covered for you (the injectable).  If so, or if we need to do a prior authorization for it, let me know and I will send in.  I feel we need to lower these levels, but are restricted due to your muscle pain history with statins.  Any questions? Keep being awesome!!  Thank you for allowing me to participate in your care. Kindest regards, Dene Landsberg

## 2020-01-30 ENCOUNTER — Telehealth: Payer: Self-pay

## 2020-01-30 ENCOUNTER — Other Ambulatory Visit: Payer: Self-pay | Admitting: Nurse Practitioner

## 2020-01-30 MED ORDER — REPATHA 140 MG/ML ~~LOC~~ SOSY: 140.0000 mg | PREFILLED_SYRINGE | SUBCUTANEOUS | 5 refills | Status: DC

## 2020-01-30 NOTE — Telephone Encounter (Signed)
PA for Repatha initiated and submitted via Cover My Meds. Key: [id number]

## 2020-02-07 NOTE — Telephone Encounter (Signed)
PA was denied

## 2020-02-07 NOTE — Telephone Encounter (Signed)
Can you alert patient, she may need to reach out to insurance again as they reported they might cover due to her statin intolerance and not meeting goal LDL with current schedule on statin.

## 2020-02-08 NOTE — Telephone Encounter (Signed)
mychart message sent to the patient

## 2020-02-12 ENCOUNTER — Telehealth: Payer: Self-pay | Admitting: Nurse Practitioner

## 2020-02-12 NOTE — Telephone Encounter (Signed)
Pts PA for Evolocumab (REPATHA) 140 MG/ML SOSY  Was denied and Ronnie from Cover My Meds called to see if provider is interested in resubmitting the PA/ please advise

## 2020-02-13 NOTE — Telephone Encounter (Signed)
PA for Repatha initiated and submitted via Cover My Meds. Key: [facility]

## 2020-02-14 ENCOUNTER — Other Ambulatory Visit: Payer: Self-pay

## 2020-02-14 ENCOUNTER — Ambulatory Visit (INDEPENDENT_AMBULATORY_CARE_PROVIDER_SITE_OTHER): Payer: 59 | Admitting: Obstetrics

## 2020-02-14 ENCOUNTER — Other Ambulatory Visit (HOSPITAL_COMMUNITY)
Admission: RE | Admit: 2020-02-14 | Discharge: 2020-02-14 | Disposition: A | Discharge status: Home / Self Care | Payer: Commercial Managed Care - PPO | Source: Ambulatory Visit | Attending: Obstetrics | Admitting: Obstetrics

## 2020-02-14 ENCOUNTER — Encounter: Payer: Self-pay | Admitting: Obstetrics

## 2020-02-14 VITALS — BP 140/100 | Ht 65.0 in | Wt 209.0 lb

## 2020-02-14 DIAGNOSIS — Z01419 Encounter for gynecological examination (general) (routine) without abnormal findings: Secondary | ICD-10-CM

## 2020-02-14 DIAGNOSIS — Z1231 Encounter for screening mammogram for malignant neoplasm of breast: Secondary | ICD-10-CM

## 2020-02-14 DIAGNOSIS — Z124 Encounter for screening for malignant neoplasm of cervix: Secondary | ICD-10-CM | POA: Insufficient documentation

## 2020-02-14 NOTE — Telephone Encounter (Signed)
Please let me know anything Margaret Fuller, she can not take more statin then she is as has myalgias and current dosing is not getting her to goal.  She would benefit greatly from Repatha and not sure why they are denying this based on her history.  Please alert patient Margaret Fuller and let her know we are reaching out to [place].

## 2020-02-14 NOTE — Telephone Encounter (Signed)
I will look into and see what options we have.  [place], do have the reason for the denials?

## 2020-02-14 NOTE — Telephone Encounter (Signed)
PA denied for the second time. Is there something else we can do? Raynelle Fanning, do you know of any assistance we can get the patient so she can get the Repatha?

## 2020-02-14 NOTE — Telephone Encounter (Signed)
Thank you, this is very frustrating as she has failed multiple statins in past and not meeting goal on low dosing.

## 2020-02-15 NOTE — Telephone Encounter (Signed)
I alerted patient via MyChart and will see if she would like to try this next step.

## 2020-02-15 NOTE — Telephone Encounter (Signed)
Looks like [facility] is requiring a 12 week trial of ezetemibe in addition to her Crestor three times weekly dosing or documentation she can not take ezetemibe.

## 2020-02-15 NOTE — Progress Notes (Signed)
Routine Annual Gynecology Examination   PCP: Marjie Skiff, NP  Chief Complaint:  Chief Complaint  Patient presents with  . Gynecologic Exam    No concerns    History of Present Illness: Patient is a 60 y.o. [id number] presents for annual exam. The patient has no complaints today.  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  She exercises by walking and uses an exercise bike as well. Menopausal bleeding: denies  Menopausal symptoms: denies  Breast symptoms: denies  Last pap smear: 1 years ago.  Result Normal  Last mammogram: 1 years ago.  Result Normal  Past Medical History:  Diagnosis Date  . Allergic rhinitis   . Anemia   . Hypercholesterolemia   . Migraine   . Osteopenia   . Vocal cord polyp     Past Surgical History:  Procedure Laterality Date  . COLONOSCOPY  2015   negative  . excision of polyps from vocal cords  1970, 1984, 1985    Prior to Admission medications   Medication Sig Start Date End Date Taking? Authorizing Provider  B Complex Vitamins (VITAMIN-B COMPLEX) TABS Take by mouth.   Yes [provider]  Calcium Carb-Cholecalciferol (CALCIUM CARBONATE-VITAMIN D3) 600-400 MG-UNIT TABS Take by mouth.   Yes [provider]  cetirizine (ZYRTEC) 10 MG tablet Take by mouth.   Yes [provider]  Estradiol (ELESTRIN) 0.52 MG/0.87 GM (0.06%) GEL APPLY ONE PUMP TO UPPER ARM AREA EVERY DAY 07/19/19  Yes Farrel Conners, CNM  Misc Natural Products (PETADOLEX 75) 75 MG CAPS Take by mouth.   Yes [provider]  Multiple Vitamin (MULTIVITAMIN) tablet Take by mouth.   Yes [provider]  progesterone (PROMETRIUM) 100 MG capsule Take 1 capsule (100 mg total) by mouth daily. 01/15/20  Yes Mirna Mires, CNM  Rosuvastatin Calcium 10 MG CPSP  09/06/19  Yes [provider]  sertraline (ZOLOFT) 100 MG tablet   01/24/18  Yes [provider]  Ubrogepant (UBRELVY) 100 MG TABS Take 1 tablet by mouth as needed.   Yes [provider]  SUMAtriptan (IMITREX) 100 MG tablet Take 1 tablet by mouth as needed. Patient not taking: Reported on 02/14/2020 06/11/19   [provider]    Allergies  Allergen Reactions  . Statins Other (See Comments)    Myopathy     Gynecologic History:  No LMP recorded. Patient is postmenopausal. Contraception: post menopausal status Last Pap: 2021. Results were: normal Last mammogram: 2021. Results were: normal  Obstetric History: [id number]  Social History   Socioeconomic History  . Marital status: Married    Spouse name: Not on file  . Number of children: 1  . Years of education: Not on file  . Highest education level: Not on file  Occupational History  . Occupation: Environmental health practitioner, Passenger transport manager  Tobacco Use  . Smoking status: Never Smoker  . Smokeless tobacco: Never Used  Vaping Use  . Vaping Use: Never used  Substance and Sexual Activity  . Alcohol use: Yes    Comment: 1/week  . Drug use: No  . Sexual activity: Yes    Birth control/protection: Post-menopausal  Other Topics Concern  . Not on file  Social History Narrative   Daughter graduated from [employer] 2020 thea(took theater and communications).   Social Determinants of Health   Financial Resource Strain: Not on file  Food Insecurity:  Not on file  Transportation Needs: Not on file  Physical Activity: Not on file  Stress: Not on file  Social Connections: Not on file  Intimate Partner Violence: Not on file    Family History  Problem Relation Age of Onset  . Diabetes Mother   . Colon polyps Mother 61  . Heart disease Mother        MI has pacemaker  . Hypertension Mother   . Diabetes Father   . Skin cancer Maternal Aunt   . Squamous cell carcinoma Sister        skin on arm  . Breast cancer Neg Hx     Review of Systems  Constitutional: Negative.    HENT: Negative.   Eyes: Negative.   Respiratory: Negative.   Cardiovascular: Negative.   Gastrointestinal: Negative.   Genitourinary: Negative.   Musculoskeletal: Negative.   Skin: Negative.   Neurological: Negative.   Endo/Heme/Allergies: Negative.   Psychiatric/Behavioral: Negative.      Physical Exam Vitals: BP (!) 140/100   Ht 5\' 5"  (1.651 m)   Wt 209 lb (94.8 kg)   BMI 34.78 kg/m   Physical Exam Constitutional:      Appearance: Normal appearance. She is obese.  Genitourinary:     Vulva normal.     Genitourinary Comments: Sparse hair growth on mons. No rashes or lesions noted Slight labial and vaginal atrophy noted. Difficult placement of speculum secondary to postmenopausal status. (Pt had deliveries via Cesarean section) Pap Smear retrieved with some difficulty as uncomfortable for patient.    HENT:     Head: Normocephalic and atraumatic.     Nose: Nose normal.  Cardiovascular:     Rate and Rhythm: Normal rate and regular rhythm.     Pulses: Normal pulses.     Heart sounds: Normal heart sounds.  Pulmonary:     Effort: Pulmonary effort is normal.  Abdominal:     General: Bowel sounds are normal.     Palpations: Abdomen is soft.  Musculoskeletal:        General: Normal range of motion.     Cervical back: Normal range of motion and neck supple.  Neurological:     General: No focal deficit present.     Mental Status: She is alert and oriented to person, place, and time.  Skin:    General: Skin is warm and dry.  Psychiatric:        Mood and Affect: Mood normal.        Behavior: Behavior normal.        Thought Content: Thought content normal.        Judgment: Judgment normal.      Female chaperone present for pelvic and breast  portions of the physical exam  Results: AUDIT Questionnaire (screen for alcoholism): NA- She does not consume alcohol    Assessment and Plan:  60 y.o. [id number] female here for routine annual gynecologic  examination  Plan: Problem List Items Addressed This Visit   None   Visit Diagnoses    Women's annual routine gynecological examination    -  Primary   Relevant Orders   Cytology - PAP   Screening for cervical cancer       Relevant Orders   Cytology - PAP   Breast cancer screening by mammogram       Relevant Orders   MM DIGITAL SCREENING BILATERAL      Screening: -- Blood pressure screen elevated: continued to monitor. -- Colonoscopy - due -  managed by PCP -- Mammogram - due. Patient to call [facility] to arrange. She understands that it is her responsibility to arrange this. -- Weight screening: obese: discussed management options, including lifestyle, dietary, and exercise. -- Depression screening negative (PHQ-9) -- Nutrition: normal -- cholesterol screening: per PCP -- osteoporosis screening: not due -- tobacco screening: not using -- alcohol screening: AUDIT questionnaire indicates low-risk usage. -- family history of breast cancer screening: done. not at high risk. -- no evidence of domestic violence or intimate partner violence. -- STD screening: gonorrhea/chlamydia NAAT not collected per patient request. -- pap smear collected per ASCCP guidelines -- flu vaccine PCP -- HPV vaccination series: not eligilbe   Mammogram ordered by this provider. The patient will schedule. WE discussed her ongoing pap smears. She is widowed and not sexually active-She may continue to get her paps annually but advised q 5 years in her case is appropriate.   Mirna Mires, CNM  02/15/2020 5:41 PM   02/15/2020 5:33 PM

## 2020-02-15 NOTE — Telephone Encounter (Signed)
Routing to [employer]

## 2020-02-18 ENCOUNTER — Encounter: Payer: Self-pay | Admitting: Obstetrics

## 2020-02-18 LAB — CYTOLOGY - PAP
Comment: NEGATIVE
Diagnosis: NEGATIVE
High risk HPV: POSITIVE — AB

## 2020-02-18 NOTE — Progress Notes (Signed)
2022 pap smear result is + for High Risk HPV. Her pa is NILM. Will plan her returning next year for repeat at her next annual.  Mirna Mires, [facility]  02/18/2020 8:46 PM

## 2020-02-19 ENCOUNTER — Other Ambulatory Visit: Payer: Self-pay | Admitting: Nurse Practitioner

## 2020-02-19 MED ORDER — EZETIMIBE 10 MG PO TABS: 10.0000 mg | ORAL_TABLET | Freq: Every day | ORAL | 3 refills | Status: DC

## 2020-03-05 ENCOUNTER — Other Ambulatory Visit: Payer: Self-pay | Admitting: Obstetrics

## 2020-03-05 DIAGNOSIS — N6489 Other specified disorders of breast: Secondary | ICD-10-CM

## 2020-03-05 DIAGNOSIS — Z1231 Encounter for screening mammogram for malignant neoplasm of breast: Secondary | ICD-10-CM

## 2020-03-14 DIAGNOSIS — Z9289 Personal history of other medical treatment: Secondary | ICD-10-CM

## 2020-03-14 DIAGNOSIS — N6489 Other specified disorders of breast: Secondary | ICD-10-CM

## 2020-03-14 DIAGNOSIS — Z1231 Encounter for screening mammogram for malignant neoplasm of breast: Secondary | ICD-10-CM

## 2020-03-18 ENCOUNTER — Other Ambulatory Visit: Payer: Self-pay | Admitting: Obstetrics

## 2020-03-20 ENCOUNTER — Other Ambulatory Visit: Payer: Self-pay | Admitting: Obstetrics

## 2020-03-20 DIAGNOSIS — N6489 Other specified disorders of breast: Secondary | ICD-10-CM

## 2020-03-21 ENCOUNTER — Other Ambulatory Visit: Payer: Self-pay | Admitting: Obstetrics

## 2020-03-21 ENCOUNTER — Encounter: Payer: Self-pay | Admitting: Obstetrics

## 2020-04-19 ENCOUNTER — Other Ambulatory Visit: Payer: Self-pay | Admitting: Obstetrics

## 2020-04-28 ENCOUNTER — Ambulatory Visit: Payer: Commercial Managed Care - PPO | Admitting: Nurse Practitioner

## 2020-04-28 ENCOUNTER — Encounter: Payer: Self-pay | Admitting: Nurse Practitioner

## 2020-04-28 ENCOUNTER — Other Ambulatory Visit: Payer: Self-pay

## 2020-04-28 VITALS — BP 116/75 | HR 67 | Temp 98.3°F | Wt 206.8 lb

## 2020-04-28 DIAGNOSIS — E7800 Pure hypercholesterolemia, unspecified: Secondary | ICD-10-CM

## 2020-04-28 DIAGNOSIS — E6609 Other obesity due to excess calories: Secondary | ICD-10-CM

## 2020-04-28 DIAGNOSIS — T466X5A Adverse effect of antihyperlipidemic and antiarteriosclerotic drugs, initial encounter: Secondary | ICD-10-CM

## 2020-04-28 DIAGNOSIS — Z6834 Body mass index (BMI) 34.0-34.9, adult: Secondary | ICD-10-CM

## 2020-04-28 DIAGNOSIS — M791 Myalgia, unspecified site: Secondary | ICD-10-CM

## 2020-04-28 NOTE — Patient Instructions (Signed)
Preventing High Cholesterol Cholesterol is a white, waxy substance similar to fat that the human body needs to help build cells. The liver makes all the cholesterol that a person's body needs. Having high cholesterol (hypercholesterolemia) increases your risk for heart disease and stroke. Extra or excess cholesterol comes from the food that you eat. High cholesterol can often be prevented with diet and lifestyle changes. If you already have high cholesterol, you can control it with diet, lifestyle changes, and medicines. How can high cholesterol affect me? If you have high cholesterol, fatty deposits (plaques) may build up on the walls of your blood vessels. The blood vessels that carry blood away from your heart are called arteries. Plaques make the arteries narrower and stiffer. This in turn can:  Restrict or block blood flow and cause blood clots to form.  Increase your risk for heart attack and stroke. What can increase my risk for high cholesterol? This condition is more likely to develop in people who:  Eat foods that are high in saturated fat or cholesterol. Saturated fat is mostly found in foods that come from animal sources.  Are overweight.  Are not getting enough exercise.  Have a family history of high cholesterol (familial hypercholesterolemia). What actions can I take to prevent this? Nutrition  Eat less saturated fat.  Avoid trans fats (partially hydrogenated oils). These are often found in margarine and in some baked goods, fried foods, and snacks bought in packages.  Avoid precooked or cured meat, such as bacon, sausages, or meat loaves.  Avoid foods and drinks that have added sugars.  Eat more fruits, vegetables, and whole grains.  Choose healthy sources of protein, such as fish, poultry, lean cuts of red meat, beans, peas, lentils, and nuts.  Choose healthy sources of fat, such as: ? Nuts. ? Vegetable oils, especially olive oil. ? Fish that have healthy fats,  such as omega-3 fatty acids. These fish include mackerel or salmon.   Lifestyle  Lose weight if you are overweight. Maintaining a healthy body mass index (BMI) can help prevent or control high cholesterol. It can also lower your risk for diabetes and high blood pressure. Ask your health care provider to help you with a diet and exercise plan to lose weight safely.  Do not use any products that contain nicotine or tobacco, such as cigarettes, e-cigarettes, and chewing tobacco. If you need help quitting, ask your health care provider. Alcohol use  Do not drink alcohol if: ? Your health care provider tells you not to drink. ? You are pregnant, may be pregnant, or are planning to become pregnant.  If you drink alcohol: ? Limit how much you use to:  0-1 drink a day for women.  0-2 drinks a day for men. ? Be aware of how much alcohol is in your drink. In the [place] one drink equals one 12 oz bottle of beer (355 mL), one 5 oz glass of wine (148 mL), or one 1 oz glass of hard liquor (44 mL). Activity  Get enough exercise. Do exercises as told by your health care provider.  Each week, do at least 150 minutes of exercise that takes a medium level of effort (moderate-intensity exercise). This kind of exercise: ? Makes your heart beat faster while allowing you to still be able to talk. ? Can be done in short sessions several times a day or longer sessions a few times a week. For example, on 5 days each week, you could walk fast or ride   your bike 3 times a day for 10 minutes each time.   Medicines  Your health care provider may recommend medicines to help lower cholesterol. This may be a medicine to lower the amount of cholesterol that your liver makes. You may need medicine if: ? Diet and lifestyle changes have not lowered your cholesterol enough. ? You have high cholesterol and other risk factors for heart disease or stroke.  Take over-the-counter and prescription medicines only as told by your  health care provider. General information  Manage your risk factors for high cholesterol. Talk with your health care provider about all your risk factors and how to lower your risk.  Manage other conditions that you have, such as diabetes or high blood pressure (hypertension).  Have blood tests to check your cholesterol levels at regular points in time as told by your health care provider.  Keep all follow-up visits as told by your health care provider. This is important. Where to find more information  American Heart Association: www.heart.org  National Heart, Lung, and Blood Institute: www.nhlbi.nih.gov Summary  High cholesterol increases your risk for heart disease and stroke. By keeping your cholesterol level low, you can reduce your risk for these conditions.  High cholesterol can often be prevented with diet and lifestyle changes.  Work with your health care provider to manage your risk factors, and have your blood tested regularly. This information is not intended to replace advice given to you by your health care provider. Make sure you discuss any questions you have with your health care provider. Document Revised: 10/17/2018 Document Reviewed: 10/17/2018 Elsevier Patient Education  2021 Elsevier Inc.  

## 2020-04-28 NOTE — Assessment & Plan Note (Signed)
Has history of poor tolerance statins, but currently tolerating 3 day a week dosing Crestor.  Lipid panel today.  If unable to get to goal with 3 day a week dosing then consider Repatha.

## 2020-04-28 NOTE — Progress Notes (Signed)
BP 116/75   Pulse 67   Temp 98.3 F (36.8 C) (Oral)   Wt 206 lb 12.8 oz (93.8 kg)   SpO2 97%   BMI 34.41 kg/m    Subjective:    Patient ID: Margaret Fuller, female    DOB: Feb 02, 1960, 60 y.o.   MRN: [id number]  HPI: Margaret Fuller is a 60 y.o. female  Chief Complaint  Patient presents with  . Hyperlipidemia   HYPERLIPIDEMIA Taking Crestor 10 MG three times a week, has history of poor tolerance to statins of different kinds.  Has occasional joint twinges with 3 times a week, but not often.  Her levels on last check were not at goal for stroke prevention with LDL 197 and TCHOL 279.  Insurance would not cover Repatha, unless patient has trial of Zetia with Crestor 3 days a week.  Zetia was added on 02/19/20.  She reports noticing occasional aches and pains with Zetia and Crestor -- not the same pain as has been in past. Hyperlipidemia status: good compliance Satisfied with current treatment?  yes Side effects:  no Medication compliance: good compliance Past cholesterol meds: multiple in past Supplements: none Aspirin:  no The 10-year ASCVD risk score Denman George DC Jr., et al., 2013) is: 3.6%   Values used to calculate the score:     Age: 60 years     Sex: Female     Is Non-Hispanic African American: No     Diabetic: No     Tobacco smoker: No     Systolic Blood Pressure: 116 mmHg     Is BP treated: No     HDL Cholesterol: 54 mg/dL     Total Cholesterol: 279 mg/dL Chest pain:  no Coronary artery disease:  no Family history CAD:  mother -- had MI in her 79's Family history early CAD:  no  Relevant past medical, surgical, family and social history reviewed and updated as indicated. Interim medical history since our last visit reviewed. Allergies and medications reviewed and updated.  Review of Systems  Constitutional: Negative for activity change, appetite change, diaphoresis, fatigue and fever.  Respiratory: Negative for cough, chest tightness and  shortness of breath.   Cardiovascular: Negative for chest pain, palpitations and leg swelling.  Gastrointestinal: Negative.   Musculoskeletal: Positive for arthralgias.  Neurological: Negative.   Psychiatric/Behavioral: Negative for decreased concentration, self-injury, sleep disturbance and suicidal ideas. The patient is not nervous/anxious.     Per HPI unless specifically indicated above     Objective:    BP 116/75   Pulse 67   Temp 98.3 F (36.8 C) (Oral)   Wt 206 lb 12.8 oz (93.8 kg)   SpO2 97%   BMI 34.41 kg/m   Wt Readings from Last 3 Encounters:  04/28/20 206 lb 12.8 oz (93.8 kg)  02/14/20 209 lb (94.8 kg)  01/22/20 204 lb 6.4 oz (92.7 kg)    Physical Exam Vitals and nursing note reviewed.  Constitutional:      General: She is awake. She is not in acute distress.    Appearance: She is well-developed and well-groomed. She is obese. She is not ill-appearing or toxic-appearing.  HENT:     Head: Normocephalic.     Right Ear: Hearing normal.     Left Ear: Hearing normal.     Nose: Nose normal.     Mouth/Throat:     Mouth: Mucous membranes are moist.  Eyes:     General: Lids are normal.  Right eye: No discharge.        Left eye: No discharge.     Conjunctiva/sclera: Conjunctivae normal.     Pupils: Pupils are equal, round, and reactive to light.  Neck:     Thyroid: No thyromegaly.     Vascular: No carotid bruit or JVD.  Cardiovascular:     Rate and Rhythm: Normal rate and regular rhythm.     Heart sounds: Normal heart sounds. No murmur heard. No gallop.   Pulmonary:     Effort: Pulmonary effort is normal. No accessory muscle usage or respiratory distress.     Breath sounds: Normal breath sounds.  Abdominal:     General: Bowel sounds are normal.     Palpations: Abdomen is soft. There is no hepatomegaly or splenomegaly.  Musculoskeletal:     Cervical back: Normal range of motion and neck supple.     Right lower leg: No edema.     Left lower leg: No  edema.  Lymphadenopathy:     Cervical: No cervical adenopathy.  Skin:    General: Skin is warm and dry.  Neurological:     Mental Status: She is alert and oriented to person, place, and time.  Psychiatric:        Attention and Perception: Attention normal.        Mood and Affect: Mood normal.        Speech: Speech normal.        Behavior: Behavior normal. Behavior is cooperative.        Thought Content: Thought content normal.     Results for orders placed or performed in visit on 02/14/20  Cytology - PAP  Result Value Ref Range   High risk HPV Positive (A)    Adequacy      Satisfactory for evaluation; transformation zone component PRESENT.   Diagnosis      - Negative for intraepithelial lesion or malignancy (NILM)   Comment Normal Reference Range HPV - Negative       Assessment & Plan:   Problem List Items Addressed This Visit      Other   Hypercholesterolemia - Primary    Chronic, ongoing.  Continue current medication regimen and adjust as needed.  Has history of poor tolerance statins, but currently tolerating 3 day a week dosing Crestor + Zetia.  Lipid panel today.  If unable to get to goal with 3 day a week dosing & Zetia then consider Repatha.  Goal LDL <70 for heart protection.       Relevant Orders   Lipid Panel w/o Chol/HDL Ratio   Obesity    BMI 34.41.  Recommended eating smaller high protein, low fat meals more frequently and exercising 30 mins a day 5 times a week with a goal of 10-15lb weight loss in the next 3 months. Patient voiced their understanding and motivation to adhere to these recommendations.       Myalgia due to statin    Has history of poor tolerance statins, but currently tolerating 3 day a week dosing Crestor.  Lipid panel today.  If unable to get to goal with 3 day a week dosing then consider Repatha.             Follow up plan: Return for as scheduled.

## 2020-04-28 NOTE — Assessment & Plan Note (Signed)
Chronic, ongoing.  Continue current medication regimen and adjust as needed.  Has history of poor tolerance statins, but currently tolerating 3 day a week dosing Crestor + Zetia.  Lipid panel today.  If unable to get to goal with 3 day a week dosing & Zetia then consider Repatha.  Goal LDL <70 for heart protection.

## 2020-04-28 NOTE — Assessment & Plan Note (Signed)
BMI 34.41.  Recommended eating smaller high protein, low fat meals more frequently and exercising 30 mins a day 5 times a week with a goal of 10-15lb weight loss in the next 3 months. Patient voiced their understanding and motivation to adhere to these recommendations.  

## 2020-04-29 LAB — LIPID PANEL W/O CHOL/HDL RATIO
Cholesterol, Total: 188 mg/dL (ref 100–199)
HDL: 48 mg/dL (ref >39)
LDL Chol Calc (NIH): 108 mg/dL — ABNORMAL HIGH (ref 0–99)
Triglycerides: 184 mg/dL — ABNORMAL HIGH (ref 0–149)
VLDL Cholesterol Cal: 32 mg/dL (ref 5–40)

## 2020-04-29 NOTE — Progress Notes (Signed)
Contacted via MyChart    Good evening Margaret Fuller, your lipid levels have returned.  LDL is trending down from 161 to now 108 and total cholesterol 279 to now 188.  Triglycerides remain slightly elevated, recommend taking 1 G Fish Oil daily.  I think for now lets continue current Zetia and Rosuvastatin regimen if you are tolerating and recheck when you see me in July.  If we do not get you to goal by that time, then we can discuss with insurance Repatha initiation.  Sound good?  Any questions? Keep being amazing!!  Thank you for allowing me to participate in your care. Kindest regards, Corrie Dandy'

## 2020-05-19 ENCOUNTER — Other Ambulatory Visit: Payer: Self-pay | Admitting: Obstetrics

## 2020-05-20 ENCOUNTER — Other Ambulatory Visit: Payer: Self-pay | Admitting: Obstetrics

## 2020-06-03 ENCOUNTER — Telehealth: Payer: Self-pay

## 2020-06-03 NOTE — Telephone Encounter (Signed)
Place her with the 3 pm slot 06/04/20 where I have procedure, hopefully that will not take 40 minutes.:)

## 2020-06-03 NOTE — Telephone Encounter (Signed)
Pt asking if she could see her PCP as a work in at the end of her day she would like to come in instead of virtual pt was scheduled to see Dr. Charlotta Newton Dr preference was for pt to be virtual Pt canceled apt with Dr.VIgg as pcp was ok for her to come in and she rather be seen in office. Is there a time that would work better for you to see her?

## 2020-06-03 NOTE — Telephone Encounter (Signed)
I Scheduled  pt with Dr.Vigg on 06/04/2020 ,Are you ok for pt to come in Tomorrow stated she had a negative at home test on 06/03/2020 Pt would prefer to come in over virtual.

## 2020-06-04 ENCOUNTER — Ambulatory Visit: Payer: Self-pay

## 2020-06-04 ENCOUNTER — Encounter: Payer: Self-pay | Admitting: Nurse Practitioner

## 2020-06-04 ENCOUNTER — Telehealth: Payer: Commercial Managed Care - PPO | Admitting: Internal Medicine

## 2020-06-04 ENCOUNTER — Telehealth (INDEPENDENT_AMBULATORY_CARE_PROVIDER_SITE_OTHER): Payer: Commercial Managed Care - PPO | Admitting: Nurse Practitioner

## 2020-06-04 ENCOUNTER — Other Ambulatory Visit: Payer: Self-pay

## 2020-06-04 VITALS — Temp 98.2°F

## 2020-06-04 DIAGNOSIS — U071 COVID-19: Secondary | ICD-10-CM | POA: Diagnosis not present

## 2020-06-04 DIAGNOSIS — J0100 Acute maxillary sinusitis, unspecified: Secondary | ICD-10-CM

## 2020-06-04 MED ORDER — AMOXICILLIN-POT CLAVULANATE 875-125 MG PO TABS: 1.0000 | ORAL_TABLET | Freq: Two times a day (BID) | ORAL | 0 refills | Status: DC

## 2020-06-04 NOTE — Telephone Encounter (Signed)
Patient called and says she has tested positive for COVID using a home test on yesterday after negative on Sunday. She says today she went out and took a PCR test and is waiting on those results. She says on Friday she received her 2nd Moderna booster, then on Saturday she had the chills, body aches, flu symptoms. On Sunday she says she felt like she has a sinus infection with nasal congestion, blowing out pasty colored mucus, teeth hurting, headache, fatigue. She says her daughter who she's around tested positive as well on yesterday. She says she has a congested cough with yellow sputum, chest feels heavy with congestion, and the cough is not as bad as it was, sounds barky. She denies chest pain, denies SOB, denies wheezing. She says she has a pulse ox at home that she uses. I advised a virtual visit would be beneficial with the symptoms she has and the colored sputum/mucus. No availability with PCP, MyChart Video visit scheduled for today at  41 with Docia Chuck, FNP, care advice given, patient verbalized understanding.  Reason for Disposition . [1] [id number] diagnosed by positive lab test (e.g., PCR, rapid self-test kit) AND [2] mild symptoms (e.g., cough, fever, others) AND [8] no complications or SOB  Answer Assessment - Initial Assessment Questions 1. COVID-19 DIAGNOSIS: "Who made your COVID-19 diagnosis?" "Was it confirmed by a positive lab test or self-test?" If not diagnosed by a doctor (or NP/PA), ask "Are there lots of cases (community spread) where you live?" Note: See public health department website, if unsure.     Antigen (positive on yesterday) and PCR (pending) 2. COVID-19 EXPOSURE: "Was there any known exposure to COVID before the symptoms began?" CDC Definition of close contact: within 6 feet (2 meters) for a total of 15 minutes or more over a 24-hour period.      No 3. ONSET: "When did the COVID-19 symptoms start?"      Saturday 4. WORST SYMPTOM: "What is your worst symptom?"  (e.g., cough, fever, shortness of breath, muscle aches)     Cough, chest congestion, heaviness in chest, teeth ache, sinus congestion 5. COUGH: "Do you have a cough?" If Yes, ask: "How bad is the cough?"       Yes, slowed down a little bit, sounds barky, productive cough yellow today 6. FEVER: "Do you have a fever?" If Yes, ask: "What is your temperature, how was it measured, and when did it start?"     Not checked today 7. RESPIRATORY STATUS: "Describe your breathing?" (e.g., shortness of breath, wheezing, unable to speak)      No 8. BETTER-SAME-WORSE: "Are you getting better, staying the same or getting worse compared to yesterday?"  If getting worse, ask, "In what way?"     A little better  9. HIGH RISK DISEASE: "Do you have any chronic medical problems?" (e.g., asthma, heart or lung disease, weak immune system, obesity, etc.)     No 10. VACCINE: "Have you had the COVID-19 vaccine?" If Yes, ask: "Which one, how many shots, when did you get it?"       2 shots, 2 boosters 11. BOOSTER: "Have you received your COVID-19 booster?" If Yes, ask: "Which one and when did you get it?"       Moderna on Friday 12. PREGNANCY: "Is there any chance you are pregnant?" "When was your last menstrual period?"       No 13. OTHER SYMPTOMS: "Do you have any other symptoms?"  (e.g., chills, fatigue, headache, loss  of smell or taste, muscle pain, sore throat)       Headache, little fatigue, sinus congestion, ears ache, teeth ache, dull taste, muscle aches on Saturday, scratchy throat 14. O2 SATURATION MONITOR:  "Do you use an oxygen saturation monitor (pulse oximeter) at home?" If Yes, ask "What is your reading (oxygen level) today?" "What is your usual oxygen saturation reading?" (e.g., 95%)       I don't have it with me  Protocols used: CORONAVIRUS (COVID-19) DIAGNOSED OR SUSPECTED-A-AH

## 2020-06-04 NOTE — Telephone Encounter (Signed)
Pt called back in to update provider that she tested positive for covid. Pt declined scheduling an apt and stated that that she will treat her symptoms otc. Pt says instead she would like to speak with  NT to ask some additional questions.

## 2020-06-04 NOTE — Progress Notes (Signed)
Temp 98.2 F (36.8 C)   SpO2 92%    Subjective:    Patient ID: Margaret Fuller, female    DOB: Jun 10, 1960, 60 y.o.   MRN: [id number]  HPI: Margaret Fuller is a 60 y.o. female  Chief Complaint  Patient presents with  . Covid Positive    Home test   UPPER RESPIRATORY TRACT INFECTION Worst symptom: COVID + yesterday.  Symptoms started on 05/31/2020.  Body aches.  Patient has been dealing with sinus symptoms for several weeks.  COVID test was negative Monday and positive on Tuesday.   Fever: no Cough: yes Shortness of breath: no Wheezing: no Chest pain: yes, with cough Chest tightness: no Chest congestion: yes Nasal congestion: yes Runny nose: yes Post nasal drip: yes Sneezing: no Sore throat: yes Swollen glands: no Sinus pressure: yes Headache: yes Face pain: yes Toothache: yes Ear pain: yes bilateral Ear pressure: yes bilateral Eyes red/itching:yes Eye drainage/crusting: no  Vomiting: no Rash: no Fatigue: yes Sick contacts: yes Strep contacts: no  Context: better Recurrent sinusitis: no Relief with OTC cold/cough medications: yes  Treatments attempted: pseudoephedrine   Relevant past medical, surgical, family and social history reviewed and updated as indicated. Interim medical history since our last visit reviewed. Allergies and medications reviewed and updated.  Review of Systems  Constitutional: Positive for fatigue. Negative for fever.  HENT: Positive for congestion, ear pain, rhinorrhea, sinus pressure, sinus pain and sore throat. Negative for dental problem, postnasal drip and sneezing.   Respiratory: Positive for cough. Negative for shortness of breath and wheezing.   Cardiovascular: Negative for chest pain.  Gastrointestinal: Negative for vomiting.  Skin: Negative for rash.  Neurological: Positive for headaches.    Per HPI unless specifically indicated above     Objective:    Temp 98.2 F (36.8 C)   SpO2 92%   Wt  Readings from Last 3 Encounters:  04/28/20 206 lb 12.8 oz (93.8 kg)  02/14/20 209 lb (94.8 kg)  01/22/20 204 lb 6.4 oz (92.7 kg)    Physical Exam Vitals and nursing note reviewed.  Pulmonary:     Effort: Pulmonary effort is normal. No respiratory distress.  Neurological:     Mental Status: She is alert.  Psychiatric:        Mood and Affect: Mood normal.        Behavior: Behavior normal.        Thought Content: Thought content normal.        Judgment: Judgment normal.     Results for orders placed or performed in visit on 04/28/20  Lipid Panel w/o Chol/HDL Ratio  Result Value Ref Range   Cholesterol, Total 188 100 - 199 mg/dL   Triglycerides 440 (H) 0 - 149 mg/dL   HDL 48 >10 mg/dL   VLDL Cholesterol Cal 32 5 - 40 mg/dL   LDL Chol Calc (NIH) 272 (H) 0 - 99 mg/dL      Assessment & Plan:   Problem List Items Addressed This Visit   None   Visit Diagnoses    Acute non-recurrent maxillary sinusitis    -  Primary   Complete course of antibiotics.  Discussed signs and symptoms to monitor for and when to seek higher level of care. Follow up if symptoms do not resolve.    Relevant Medications   amoxicillin-clavulanate (AUGMENTIN) 875-125 MG tablet   COVID-19       Reviewed symptom management.  Discussed signs and symptoms to monitor for and when  to seek higher level of care. Follow up if symptoms do not resolve.        Follow up plan: Return if symptoms worsen or fail to improve.   This visit was completed via MyChart due to the restrictions of the COVID-19 pandemic. All issues as above were discussed and addressed. Physical exam was done as above through visual confirmation on MyChart. If it was felt that the patient should be evaluated in the office, they were directed there. The patient verbally consented to this visit. 1. Location of the patient: Home 2. Location of the provider: Office 3. Those involved with this call:  ? Provider: Larae Grooms, NP ? CMA: Tiffany  Reel, CMA ? Front Desk/Registration: [facility] 4. Time spent on call: 15 minutes with patient  via phone conference. More than 50% of this time was spent in counseling and coordination of care. 21 minutes total spent in review of patient's record and preparation of their chart.

## 2020-06-04 NOTE — Telephone Encounter (Signed)
Lvm to ask pt if ok with this time and day

## 2020-06-12 ENCOUNTER — Telehealth: Payer: Self-pay

## 2020-06-12 NOTE — Telephone Encounter (Signed)
Fax request received from [facility] addressed to Margaret Fuller, CNM for [employer] Gel [place] [employer]k. Please refill/send new rx. AE 02/14/20

## 2020-06-20 ENCOUNTER — Ambulatory Visit
Admission: RE | Admit: 2020-06-20 | Discharge: 2020-06-20 | Disposition: A | Discharge status: Home / Self Care | Payer: Commercial Managed Care - PPO | Source: Ambulatory Visit | Attending: Obstetrics | Admitting: Obstetrics

## 2020-06-20 ENCOUNTER — Other Ambulatory Visit: Payer: Self-pay

## 2020-06-20 DIAGNOSIS — N6489 Other specified disorders of breast: Secondary | ICD-10-CM

## 2020-08-01 ENCOUNTER — Encounter: Payer: Self-pay | Admitting: Nurse Practitioner

## 2020-08-01 ENCOUNTER — Other Ambulatory Visit: Payer: Self-pay

## 2020-08-01 ENCOUNTER — Ambulatory Visit (INDEPENDENT_AMBULATORY_CARE_PROVIDER_SITE_OTHER): Payer: Commercial Managed Care - PPO | Admitting: Nurse Practitioner

## 2020-08-01 VITALS — BP 118/75 | HR 66 | Temp 98.7°F | Ht 65.0 in | Wt 203.8 lb

## 2020-08-01 DIAGNOSIS — Z23 Encounter for immunization: Secondary | ICD-10-CM | POA: Diagnosis not present

## 2020-08-01 DIAGNOSIS — M85851 Other specified disorders of bone density and structure, right thigh: Secondary | ICD-10-CM

## 2020-08-01 DIAGNOSIS — F320 Major depressive disorder, single episode, mild: Secondary | ICD-10-CM | POA: Diagnosis not present

## 2020-08-01 DIAGNOSIS — Z6833 Body mass index (BMI) 33.0-33.9, adult: Secondary | ICD-10-CM

## 2020-08-01 DIAGNOSIS — G43119 Migraine with aura, intractable, without status migrainosus: Secondary | ICD-10-CM

## 2020-08-01 DIAGNOSIS — E6609 Other obesity due to excess calories: Secondary | ICD-10-CM

## 2020-08-01 DIAGNOSIS — E7800 Pure hypercholesterolemia, unspecified: Secondary | ICD-10-CM

## 2020-08-01 DIAGNOSIS — Z862 Personal history of diseases of the blood and blood-forming organs and certain disorders involving the immune mechanism: Secondary | ICD-10-CM

## 2020-08-01 DIAGNOSIS — E66811 Obesity, class 1: Secondary | ICD-10-CM

## 2020-08-01 DIAGNOSIS — R7989 Other specified abnormal findings of blood chemistry: Secondary | ICD-10-CM

## 2020-08-01 DIAGNOSIS — M791 Myalgia, unspecified site: Secondary | ICD-10-CM

## 2020-08-01 DIAGNOSIS — Z0000 Encounter for general adult medical examination without abnormal findings: Secondary | ICD-10-CM

## 2020-08-01 DIAGNOSIS — T466X5A Adverse effect of antihyperlipidemic and antiarteriosclerotic drugs, initial encounter: Secondary | ICD-10-CM

## 2020-08-01 MED ORDER — SHINGRIX 50 MCG/0.5ML IM SUSR: 0.5000 mL | Freq: Once | INTRAMUSCULAR | 0 refills | Status: AC

## 2020-08-01 NOTE — Assessment & Plan Note (Signed)
Noted on DEXA in 2017 with T -1.6.  Continue daily supplements, Calcium and Vitamin D.  Plan to repeat DEXA in September 2022.  Check Vit D level today.

## 2020-08-01 NOTE — Assessment & Plan Note (Signed)
Has history of poor tolerance statins, but currently tolerating 3 day a week dosing Crestor.  Lipid panel today.  If unable to get to goal with 3 day a week dosing then consider Repatha.

## 2020-08-01 NOTE — Assessment & Plan Note (Signed)
Chronic, ongoing.  Followed by neurology, continue this collaboration and current medication regimen as prescribed by them.  Return in 6 months.

## 2020-08-01 NOTE — Assessment & Plan Note (Signed)
Recent levels stable, will recheck today.  Initiate medication as needed.

## 2020-08-01 NOTE — Patient Instructions (Signed)
Preventing High Cholesterol Cholesterol is a white, waxy substance similar to fat that the human body needs to help build cells. The liver makes all the cholesterol that a person's body needs. Having high cholesterol (hypercholesterolemia) increases your risk for heart disease and stroke. Extra or excess cholesterolcomes from the food that you eat. High cholesterol can often be prevented with diet and lifestyle changes. If you already have high cholesterol, you can control it with diet, lifestyle changes,and medicines. How can high cholesterol affect me? If you have high cholesterol, fatty deposits (plaques) may build up on the walls of your blood vessels. The blood vessels that carry blood away from your heart are called arteries. Plaques make the arteries narrower and stiffer. This in turn can: Restrict or block blood flow and cause blood clots to form. Increase your risk for heart attack and stroke. What can increase my risk for high cholesterol? This condition is more likely to develop in people who: Eat foods that are high in saturated fat or cholesterol. Saturated fat is mostly found in foods that come from animal sources. Are overweight. Are not getting enough exercise. Have a family history of high cholesterol (familial hypercholesterolemia). What actions can I take to prevent this? Nutrition  Eat less saturated fat. Avoid trans fats (partially hydrogenated oils). These are often found in margarine and in some baked goods, fried foods, and snacks bought in packages. Avoid precooked or cured meat, such as bacon, sausages, or meat loaves. Avoid foods and drinks that have added sugars. Eat more fruits, vegetables, and whole grains. Choose healthy sources of protein, such as fish, poultry, lean cuts of red meat, beans, peas, lentils, and nuts. Choose healthy sources of fat, such as: Nuts. Vegetable oils, especially olive oil. Fish that have healthy fats, such as omega-3 fatty acids.  These fish include mackerel or salmon.  Lifestyle Lose weight if you are overweight. Maintaining a healthy body mass index (BMI) can help prevent or control high cholesterol. It can also lower your risk for diabetes and high blood pressure. Ask your health care provider to help you with a diet and exercise plan to lose weight safely. Do not use any products that contain nicotine or tobacco, such as cigarettes, e-cigarettes, and chewing tobacco. If you need help quitting, ask your health care provider. Alcohol use Do not drink alcohol if: Your health care provider tells you not to drink. You are pregnant, may be pregnant, or are planning to become pregnant. If you drink alcohol: Limit how much you use to: 0-1 drink a day for women. 0-2 drinks a day for men. Be aware of how much alcohol is in your drink. In the [place] one drink equals one 12 oz bottle of beer (355 mL), one 5 oz glass of wine (148 mL), or one 1 oz glass of hard liquor (44 mL). Activity  Get enough exercise. Do exercises as told by your health care provider. Each week, do at least 150 minutes of exercise that takes a medium level of effort (moderate-intensity exercise). This kind of exercise: Makes your heart beat faster while allowing you to still be able to talk. Can be done in short sessions several times a day or longer sessions a few times a week. For example, on 5 days each week, you could walk fast or ride your bike 3 times a day for 10 minutes each time.  Medicines Your health care provider may recommend medicines to help lower cholesterol. This may be a medicine to lower   the amount of cholesterol that your liver makes. You may need medicine if: Diet and lifestyle changes have not lowered your cholesterol enough. You have high cholesterol and other risk factors for heart disease or stroke. Take over-the-counter and prescription medicines only as told by your health care provider. General information Manage your risk  factors for high cholesterol. Talk with your health care provider about all your risk factors and how to lower your risk. Manage other conditions that you have, such as diabetes or high blood pressure (hypertension). Have blood tests to check your cholesterol levels at regular points in time as told by your health care provider. Keep all follow-up visits as told by your health care provider. This is important. Where to find more information American Heart Association: www.heart.org National Heart, Lung, and Blood Institute: www.nhlbi.nih.gov Summary High cholesterol increases your risk for heart disease and stroke. By keeping your cholesterol level low, you can reduce your risk for these conditions. High cholesterol can often be prevented with diet and lifestyle changes. Work with your health care provider to manage your risk factors, and have your blood tested regularly. This information is not intended to replace advice given to you by your health care provider. Make sure you discuss any questions you have with your healthcare provider. Document Revised: 10/17/2018 Document Reviewed: 10/17/2018 Elsevier Patient Education  2022 [employer]  

## 2020-08-01 NOTE — Assessment & Plan Note (Signed)
BMI 33.91.  Recommended eating smaller high protein, low fat meals more frequently and exercising 30 mins a day 5 times a week with a goal of 10-15lb weight loss in the next 3 months. Patient voiced their understanding and motivation to adhere to these recommendations.

## 2020-08-01 NOTE — Assessment & Plan Note (Addendum)
Chronic, ongoing.  Continue this regimen and adjust as needed.  Stable on regimen at this time.  Denies SI/HI.  Return in 6 months. 

## 2020-08-01 NOTE — Progress Notes (Signed)
BP 118/75   Pulse 66   Temp 98.7 F (37.1 C) (Oral)   Ht 5\' 5"  (1.651 m)   Wt 203 lb 12.8 oz (92.4 kg)   SpO2 97%   BMI 33.91 kg/m    Subjective:    Patient ID: , female    DOB: 04-15-1960, 60 y.o.   MRN: [id number]  HPI: Margaret Fuller is a 60 y.o. female presenting on 08/01/2020 for comprehensive medical examination. Current medical complaints include:none  She currently lives with: husban Menopausal Symptoms: no  She is followed by neurology for migraines, last saw on 07/16/20 -- started her on a muscle relaxer.  HYPERLIPIDEMIA Taking Crestor 10 MG three times a week and Zetia 10 MG daily, has history of poor tolerance to statins of different kinds.  Has occasional joint pains with this, 4-5 times a week in toes. Insurance would not cover Repatha. Hyperlipidemia status: good compliance Satisfied with current treatment?  yes Side effects:  no Medication compliance: good compliance Past cholesterol meds: multiple in past Supplements: none Aspirin:  no The 10-year ASCVD risk score 07/18/20 DC Jr., et al., 2013) is: 2.9%   Values used to calculate the score:     Age: 32 years     Sex: Female     Is Non-Hispanic African American: No     Diabetic: No     Tobacco smoker: No     Systolic Blood Pressure: 118 mmHg     Is BP treated: No     HDL Cholesterol: 48 mg/dL     Total Cholesterol: 188 mg/dL Chest pain:  no Coronary artery disease:  no Family history CAD:  mother -- had MI in her 68's Family history early CAD:  no   HISTORY OF ELEVATED TSH Recent levels stable -- TSH 2.970 and Free T4 0.90. Fatigue: occasional Cold intolerance: yes Heat intolerance: no Weight gain: yes Weight loss: no Constipation: occasional Diarrhea/loose stools: no Palpitations: no Lower extremity edema: no Anxiety/depressed mood: has underlying   DEPRESSION Has taken Zoloft 100 MG for 2 1/2 years, which works well. Prescribed by neurology.    Mood status: stable Satisfied with current treatment?: yes Symptom severity: moderate  Duration of current treatment : chronic Side effects: no Medication compliance: good compliance Psychotherapy/counseling: none Depressed mood: none Anxious mood: occasional Anhedonia: no Significant weight loss or gain: no Insomnia: none Fatigue: no Feelings of worthlessness or guilt: no Impaired concentration/indecisiveness: no Suicidal ideations: no Hopelessness: no Crying spells: no Depression screen [facility] 2/9 08/01/2020 04/28/2020 01/22/2020 07/26/2019  Decreased Interest 0 0 0 0  Down, Depressed, Hopeless 0 0 1 0  PHQ - 2 Score 0 0 1 0  Altered sleeping 0 0 0 0  Tired, decreased energy 0 0 0 0  Change in appetite 0 0 0 0  Feeling bad or failure about yourself  0 0 0 0  Trouble concentrating 0 0 0 0  Moving slowly or fidgety/restless 0 0 0 0  Suicidal thoughts 0 0 0 0  PHQ-9 Score 0 0 1 0  Difficult doing work/chores Not difficult at all - - Not difficult at all    The patient does not have a history of falls. I did not complete a risk assessment for falls. A plan of care for falls was not documented.   Past Medical History:  Past Medical History:  Diagnosis Date   Allergic rhinitis    Anemia    Hypercholesterolemia    Migraine  Osteopenia    Vocal cord polyp     Surgical History:  Past Surgical History:  Procedure Laterality Date   COLONOSCOPY  2015   negative   excision of polyps from vocal cords  1970, 1984, 1985    Medications:  Current Outpatient Medications on File Prior to Visit  Medication Sig   B Complex Vitamins (VITAMIN-B COMPLEX) TABS Take by mouth.   Calcium Carb-Cholecalciferol (CALCIUM CARBONATE-VITAMIN D3) 600-400 MG-UNIT TABS Take by mouth.   cetirizine (ZYRTEC) 10 MG tablet Take by mouth.   Estradiol (ELESTRIN) 0.52 MG/0.87 GM (0.06%) GEL APPLY ONE PUMP TO UPPER ARM AREA EVERY DAY   ezetimibe (ZETIA) 10 MG tablet Take 1 tablet (10 mg total) by mouth  daily.   Misc Natural Products (PETADOLEX 75) 75 MG CAPS Take by mouth.   Multiple Vitamin (MULTIVITAMIN) tablet Take by mouth.   progesterone (PROMETRIUM) 100 MG capsule TAKE 1 CAPSULE BY MOUTH ONCE DAILY   Rosuvastatin Calcium 10 MG CPSP    sertraline (ZOLOFT) 100 MG tablet Take 1 tablet by mouth daily.   SUMAtriptan (IMITREX) 100 MG tablet Take 1 tablet by mouth as needed.   tiZANidine (ZANAFLEX) 4 MG tablet Take by mouth.   No current facility-administered medications on file prior to visit.    Allergies:  Allergies  Allergen Reactions   Statins Other (See Comments)    Myopathy     Social History:  Social History   Socioeconomic History   Marital status: Married    Spouse name: Not on file   Number of children: 1   Years of education: Not on file   Highest education level: Not on file  Occupational History   Occupation: Environmental health practitioner, Passenger transport manager  Tobacco Use   Smoking status: Never   Smokeless tobacco: Never  Vaping Use   Vaping Use: Never used  Substance and Sexual Activity   Alcohol use: Yes    Comment: 1/week   Drug use: No   Sexual activity: Yes    Birth control/protection: Post-menopausal  Other Topics Concern   Not on file  Social History Narrative   Daughter graduated from [employer] 2020 thea(took theater and communications).   Social Determinants of Health   Financial Resource Strain: Not on file  Food Insecurity: Not on file  Transportation Needs: Not on file  Physical Activity: Not on file  Stress: Not on file  Social Connections: Not on file  Intimate Partner Violence: Not on file   Social History   Tobacco Use  Smoking Status Never  Smokeless Tobacco Never   Social History   Substance and Sexual Activity  Alcohol Use Yes   Comment: 1/week    Family History:  Family History  Problem Relation Age of Onset   Diabetes Mother    Colon polyps Mother 50   Heart disease Mother        MI has pacemaker   Hypertension  Mother    Diabetes Father    Skin cancer Maternal Aunt    Squamous cell carcinoma Sister        skin on arm   Breast cancer Neg Hx     Past medical history, surgical history, medications, allergies, family history and social history reviewed with patient today and changes made to appropriate areas of the chart.   ROS All other ROS negative except what is listed above and in the HPI.      Objective:    BP 118/75   Pulse 66   Temp 98.7  F (37.1 C) (Oral)   Ht 5\' 5"  (1.651 m)   Wt 203 lb 12.8 oz (92.4 kg)   SpO2 97%   BMI 33.91 kg/m   Wt Readings from Last 3 Encounters:  08/01/20 203 lb 12.8 oz (92.4 kg)  04/28/20 206 lb 12.8 oz (93.8 kg)  02/14/20 209 lb (94.8 kg)    Physical Exam Vitals and nursing note reviewed.  Constitutional:      General: She is awake. She is not in acute distress.    Appearance: She is well-developed. She is not ill-appearing.  HENT:     Head: Normocephalic and atraumatic.     Right Ear: Hearing, tympanic membrane, ear canal and external ear normal. No drainage.     Left Ear: Hearing, tympanic membrane, ear canal and external ear normal. No drainage.     Nose: Nose normal.     Right Sinus: No maxillary sinus tenderness or frontal sinus tenderness.     Left Sinus: No maxillary sinus tenderness or frontal sinus tenderness.     Mouth/Throat:     Mouth: Mucous membranes are moist.     Pharynx: Oropharynx is clear. Uvula midline. No pharyngeal swelling, oropharyngeal exudate or posterior oropharyngeal erythema.  Eyes:     General: Lids are normal.        Right eye: No discharge.        Left eye: No discharge.     Extraocular Movements: Extraocular movements intact.     Conjunctiva/sclera: Conjunctivae normal.     Pupils: Pupils are equal, round, and reactive to light.     Visual Fields: Right eye visual fields normal and left eye visual fields normal.  Neck:     Thyroid: No thyromegaly.     Vascular: No carotid bruit.     Trachea: Trachea  normal.  Cardiovascular:     Rate and Rhythm: Normal rate and regular rhythm.     Heart sounds: Normal heart sounds. No murmur heard.   No gallop.  Pulmonary:     Effort: Pulmonary effort is normal. No accessory muscle usage or respiratory distress.     Breath sounds: Normal breath sounds.  Abdominal:     General: Bowel sounds are normal.     Palpations: Abdomen is soft. There is no hepatomegaly or splenomegaly.     Tenderness: There is no abdominal tenderness.  Musculoskeletal:        General: Normal range of motion.     Cervical back: Normal range of motion and neck supple.     Right lower leg: No edema.     Left lower leg: No edema.  Lymphadenopathy:     Head:     Right side of head: No submental, submandibular, tonsillar, preauricular or posterior auricular adenopathy.     Left side of head: No submental, submandibular, tonsillar, preauricular or posterior auricular adenopathy.     Cervical: No cervical adenopathy.  Skin:    General: Skin is warm and dry.     Capillary Refill: Capillary refill takes less than 2 seconds.     Findings: No rash.  Neurological:     Mental Status: She is alert and oriented to person, place, and time.     Cranial Nerves: Cranial nerves are intact.     Gait: Gait is intact.     Deep Tendon Reflexes: Reflexes are normal and symmetric.     Reflex Scores:      Brachioradialis reflexes are 2+ on the right side and 2+ on the  left side.      Patellar reflexes are 2+ on the right side and 2+ on the left side. Psychiatric:        Attention and Perception: Attention normal.        Mood and Affect: Mood normal.        Speech: Speech normal.        Behavior: Behavior normal. Behavior is cooperative.        Thought Content: Thought content normal.        Judgment: Judgment normal.    Results for orders placed or performed in visit on 04/28/20  Lipid Panel w/o Chol/HDL Ratio  Result Value Ref Range   Cholesterol, Total 188 100 - 199 mg/dL    Triglycerides 161 (H) 0 - 149 mg/dL   HDL 48 >09 mg/dL   VLDL Cholesterol Cal 32 5 - 40 mg/dL   LDL Chol Calc (NIH) 604 (H) 0 - 99 mg/dL      Assessment & Plan:   Problem List Items Addressed This Visit       Cardiovascular and Mediastinum   Intractable migraine with aura without status migrainosus    Chronic, ongoing.  Followed by neurology, continue this collaboration and current medication regimen as prescribed by them.  Return in 6 months.       Relevant Medications   sertraline (ZOLOFT) 100 MG tablet   tiZANidine (ZANAFLEX) 4 MG tablet     Musculoskeletal and Integument   Osteopenia of neck of right femur    Noted on DEXA in 2017 with T -1.6.  Continue daily supplements, Calcium and Vitamin D.  Plan to repeat DEXA in September 2022.  Check Vit D level today.       Relevant Orders   VITAMIN D 25 Hydroxy (Vit-D Deficiency, Fractures)     Other   Hypercholesterolemia    Chronic, ongoing.  Continue current medication regimen and adjust as needed.  Has history of poor tolerance statins, but currently tolerating 3 day a week dosing Crestor + Zetia.  Lipid panel today.  If unable to get to goal with 3 day a week dosing & Zetia then consider Repatha.  Goal LDL <70 for heart protection.        Relevant Orders   Comprehensive metabolic panel   Lipid Panel w/o Chol/HDL Ratio   History of anemia    Reports history of anemia, check CBC today.       Relevant Orders   CBC with Differential/Platelet   Elevated TSH    Recent levels stable, will recheck today.  Initiate medication as needed.       Relevant Orders   TSH   T4, free   Depression, major, single episode, mild (HCC) - Primary    Chronic, ongoing.  Continue this regimen and adjust as needed.  Stable on regimen at this time.  Denies SI/HI.  Return in 6 months.       Relevant Medications   sertraline (ZOLOFT) 100 MG tablet   Obesity    BMI 33.91.  Recommended eating smaller high protein, low fat meals more  frequently and exercising 30 mins a day 5 times a week with a goal of 10-15lb weight loss in the next 3 months. Patient voiced their understanding and motivation to adhere to these recommendations.        Myalgia due to statin    Has history of poor tolerance statins, but currently tolerating 3 day a week dosing Crestor.  Lipid panel today.  If  unable to get to goal with 3 day a week dosing then consider Repatha.          Other Visit Diagnoses     Annual physical exam       Annual physical today and health maintenance reviewed with patient.  Shingrix ordered.   Need for shingles vaccine       Shingrix ordered.   Relevant Medications   Zoster Vaccine Adjuvanted [facility]) injection   Zoster Vaccine Adjuvanted [facility]) injection (Start on 01/28/2021)        Follow up plan: Return in about 6 months (around 02/01/2021) for HLD, MOOD.   LABORATORY TESTING:  - Pap smear: up to date  IMMUNIZATIONS:   - Tdap: Tetanus vaccination status reviewed: last tetanus booster within 10 years. - Influenza: Up to date - Pneumovax: Not applicable - Prevnar: Not applicable - HPV: Not applicable - Zostavax vaccine:  ordered today  SCREENING: -Mammogram: Up to date  - Colonoscopy: Up to date  - Bone Density: Not applicable  -Hearing Test: Not applicable  -Spirometry: Not applicable   PATIENT COUNSELING:   Advised to take 1 mg of folate supplement per day if capable of pregnancy.   Sexuality: Discussed sexually transmitted diseases, partner selection, use of condoms, avoidance of unintended pregnancy  and contraceptive alternatives.   Advised to avoid cigarette smoking.  I discussed with the patient that most people either abstain from alcohol or drink within safe limits (<=14/week and <=4 drinks/occasion for males, <=7/weeks and <= 3 drinks/occasion for females) and that the risk for alcohol disorders and other health effects rises proportionally with the number of drinks per week and  how often a drinker exceeds daily limits.  Discussed cessation/primary prevention of drug use and availability of treatment for abuse.   Diet: Encouraged to adjust caloric intake to maintain  or achieve ideal body weight, to reduce intake of dietary saturated fat and total fat, to limit sodium intake by avoiding high sodium foods and not adding table salt, and to maintain adequate dietary potassium and calcium preferably from fresh fruits, vegetables, and low-fat dairy products.    Stressed the importance of regular exercise  Injury prevention: Discussed safety belts, safety helmets, smoke detector, smoking near bedding or upholstery.   Dental health: Discussed importance of regular tooth brushing, flossing, and dental visits.    NEXT PREVENTATIVE PHYSICAL DUE IN 1 YEAR. Return in about 6 months (around 02/01/2021) for HLD, MOOD.

## 2020-08-01 NOTE — Assessment & Plan Note (Signed)
Reports history of anemia, check CBC today.

## 2020-08-01 NOTE — Assessment & Plan Note (Signed)
Chronic, ongoing.  Continue current medication regimen and adjust as needed.  Has history of poor tolerance statins, but currently tolerating 3 day a week dosing Crestor + Zetia.  Lipid panel today.  If unable to get to goal with 3 day a week dosing & Zetia then consider Repatha.  Goal LDL <70 for heart protection.

## 2020-08-02 LAB — LIPID PANEL W/O CHOL/HDL RATIO
Cholesterol, Total: 198 mg/dL (ref 100–199)
HDL: 46 mg/dL (ref >39)
LDL Chol Calc (NIH): 118 mg/dL — ABNORMAL HIGH (ref 0–99)
Triglycerides: 195 mg/dL — ABNORMAL HIGH (ref 0–149)
VLDL Cholesterol Cal: 34 mg/dL (ref 5–40)

## 2020-08-02 LAB — T4, FREE: Free T4: 0.92 ng/dL (ref 0.82–1.77)

## 2020-08-02 LAB — COMPREHENSIVE METABOLIC PANEL
ALT: 19 IU/L (ref 0–32)
AST: 29 IU/L (ref 0–40)
Albumin/Globulin Ratio: 2 (ref 1.2–2.2)
Albumin: 4.7 g/dL (ref 3.8–4.9)
Alkaline Phosphatase: 58 IU/L (ref 44–121)
BUN/Creatinine Ratio: 17 (ref 12–28)
BUN: 13 mg/dL (ref 8–27)
Bilirubin Total: 0.4 mg/dL (ref 0.0–1.2)
CO2: 25 mmol/L (ref 20–29)
Calcium: 9.6 mg/dL (ref 8.7–10.3)
Chloride: 103 mmol/L (ref 96–106)
Creatinine, Ser: 0.76 mg/dL (ref 0.57–1.00)
Globulin, Total: 2.3 g/dL (ref 1.5–4.5)
Glucose: 88 mg/dL (ref 65–99)
Potassium: 4.5 mmol/L (ref 3.5–5.2)
Sodium: 141 mmol/L (ref 134–144)
Total Protein: 7 g/dL (ref 6.0–8.5)
eGFR: 90 mL/min/{1.73_m2} (ref >59)

## 2020-08-02 LAB — CBC WITH DIFFERENTIAL/PLATELET
Basophils Absolute: 0.1 10*3/uL (ref 0.0–0.2)
Basos: 2 %
EOS (ABSOLUTE): 0.2 10*3/uL (ref 0.0–0.4)
Eos: 3 %
Hematocrit: 38.5 % (ref 34.0–46.6)
Hemoglobin: 12.5 g/dL (ref 11.1–15.9)
Immature Grans (Abs): 0 10*3/uL (ref 0.0–0.1)
Immature Granulocytes: 0 %
Lymphocytes Absolute: 1.8 10*3/uL (ref 0.7–3.1)
Lymphs: 34 %
MCH: 31.4 pg (ref 26.6–33.0)
MCHC: 32.5 g/dL (ref 31.5–35.7)
MCV: 97 fL (ref 79–97)
Monocytes Absolute: 0.4 10*3/uL (ref 0.1–0.9)
Monocytes: 7 %
Neutrophils Absolute: 2.8 10*3/uL (ref 1.4–7.0)
Neutrophils: 54 %
Platelets: 295 10*3/uL (ref 150–450)
RBC: 3.98 x10E6/uL (ref 3.77–5.28)
RDW: 12.3 % (ref 11.7–15.4)
WBC: 5.2 10*3/uL (ref 3.4–10.8)

## 2020-08-02 LAB — TSH: TSH: 2.5 u[IU]/mL (ref 0.450–4.500)

## 2020-08-02 LAB — VITAMIN D 25 HYDROXY (VIT D DEFICIENCY, FRACTURES): Vit D, 25-Hydroxy: 39.4 ng/mL (ref 30.0–100.0)

## 2020-08-02 NOTE — Progress Notes (Signed)
Contacted via MyChart   Good morning Zalayah, your labs have returned.  Overall, everything looks fantastic with exception of cholesterol levels not at goal.  Would love to see that LDL come down.  I will see if on Monday we can get that authorization for the injectable since we have tried all alternatives your insurance has requested and still not at goal.  Remainder of labs are stable.  Continue all medications.  Any questions? Keep being awesome!!  Thank you for allowing me to participate in your care.  I appreciate you. Kindest regards, Dominic Mahaney

## 2020-08-05 ENCOUNTER — Other Ambulatory Visit: Payer: Self-pay | Admitting: Nurse Practitioner

## 2020-08-05 MED ORDER — REPATHA 140 MG/ML ~~LOC~~ SOSY: 140.0000 mg | PREFILLED_SYRINGE | SUBCUTANEOUS | 5 refills | Status: DC

## 2020-08-06 ENCOUNTER — Telehealth: Payer: Self-pay

## 2020-08-06 NOTE — Telephone Encounter (Signed)
PA for Repatha initiated and submitted via Cover My Meds. Key: [id number]

## 2020-08-07 ENCOUNTER — Encounter: Payer: Self-pay | Admitting: Nurse Practitioner

## 2020-08-07 DIAGNOSIS — Z8249 Family history of ischemic heart disease and other diseases of the circulatory system: Secondary | ICD-10-CM | POA: Insufficient documentation

## 2020-08-07 DIAGNOSIS — Z83438 Family history of other disorder of lipoprotein metabolism and other lipidemia: Secondary | ICD-10-CM | POA: Insufficient documentation

## 2020-08-14 ENCOUNTER — Other Ambulatory Visit: Payer: Self-pay | Admitting: Nurse Practitioner

## 2020-08-14 MED ORDER — REPATHA 140 MG/ML ~~LOC~~ SOSY: 140.0000 mg | PREFILLED_SYRINGE | SUBCUTANEOUS | 5 refills | Status: DC

## 2020-11-06 ENCOUNTER — Other Ambulatory Visit: Payer: Self-pay

## 2020-11-06 MED ORDER — ELESTRIN 0.52 MG/0.87 GM (0.06%) TD GEL: TRANSDERMAL | 0 refills | Status: DC

## 2020-11-07 ENCOUNTER — Other Ambulatory Visit: Payer: Self-pay | Admitting: Advanced Practice Midwife

## 2020-11-07 DIAGNOSIS — N951 Menopausal and female climacteric states: Secondary | ICD-10-CM

## 2020-11-07 MED ORDER — ESTRADIOL 0.1 MG/24HR TD PTWK: 0.1000 mg | MEDICATED_PATCH | TRANSDERMAL | 12 refills | Status: DC

## 2020-11-07 NOTE — Progress Notes (Signed)
Climara patch sent as alternative to elestrin gel (backordered). Patient may prefer to switch back when medication is available. Discuss with MMF.

## 2021-02-03 ENCOUNTER — Ambulatory Visit: Payer: Commercial Managed Care - PPO | Admitting: Nurse Practitioner

## 2021-02-03 ENCOUNTER — Other Ambulatory Visit: Payer: Self-pay

## 2021-02-03 ENCOUNTER — Encounter: Payer: Self-pay | Admitting: Nurse Practitioner

## 2021-02-03 VITALS — BP 121/83 | HR 86 | Temp 99.2°F | Ht 65.0 in | Wt 208.0 lb

## 2021-02-03 DIAGNOSIS — E7800 Pure hypercholesterolemia, unspecified: Secondary | ICD-10-CM | POA: Diagnosis not present

## 2021-02-03 DIAGNOSIS — M791 Myalgia, unspecified site: Secondary | ICD-10-CM

## 2021-02-03 DIAGNOSIS — E6609 Other obesity due to excess calories: Secondary | ICD-10-CM

## 2021-02-03 DIAGNOSIS — F320 Major depressive disorder, single episode, mild: Secondary | ICD-10-CM

## 2021-02-03 DIAGNOSIS — T466X5A Adverse effect of antihyperlipidemic and antiarteriosclerotic drugs, initial encounter: Secondary | ICD-10-CM

## 2021-02-03 DIAGNOSIS — Z6834 Body mass index (BMI) 34.0-34.9, adult: Secondary | ICD-10-CM

## 2021-02-03 NOTE — Assessment & Plan Note (Signed)
BMI 34.61. Recommended eating smaller high protein, low fat meals more frequently and exercising 30 mins a day 5 times a week with a goal of 10-15lb weight loss in the next 3 months. Patient voiced their understanding and motivation to adhere to these recommendations. ° °

## 2021-02-03 NOTE — Assessment & Plan Note (Signed)
Has history of poor tolerance to multiple statins & Zetia, but currently tolerating Repatha well.  Lipid panel today.   

## 2021-02-03 NOTE — Patient Instructions (Signed)

## 2021-02-03 NOTE — Assessment & Plan Note (Signed)
Chronic, ongoing.  Continue this regimen and adjust as needed, will take over refilling regimen if needed due to her neurology provider leaving.  Stable on regimen at this time.  Denies SI/HI.

## 2021-02-03 NOTE — Progress Notes (Signed)
BP 121/83    Pulse 86    Temp 99.2 F (37.3 C) (Oral)    Ht _0  (1.651 m)    Wt 208 lb (94.3 kg)    SpO2 97%    BMI 34.61 kg/m    Subjective:    Patient ID: Margaret Fuller, female    DOB: April 06, 1960, 61 y.o.   MRN: [id number]  HPI: Margaret Fuller is a 61 y.o. female  Chief Complaint  Patient presents with   Medication Management    Patient states she is here to follow up on her Repatha prescription and states she is doing well with the injections. Patient states she really like the medication.    HYPERLIPIDEMIA Started on [place] in July due to statin intolerance and significant family cardiac history, is tolerating well without ADR. Hyperlipidemia status: good compliance Satisfied with current treatment?  yes Side effects:  no Medication compliance: good compliance Past cholesterol meds: atorvastain (lipitor), pravastatin (pravachol), rosuvastatin (crestor), simvastatin (zocor), and ezetimide (zetia) Supplements: fish oil Aspirin:  no The 10-year ASCVD risk score (Arnett DK, et al., 2019) is: 3.6%   Values used to calculate the score:     Age: 19 years     Sex: Female     Is Non-Hispanic African American: No     Diabetic: No     Tobacco smoker: No     Systolic Blood Pressure: 789 mmHg     Is BP treated: No     HDL Cholesterol: 46 mg/dL     Total Cholesterol: 198 mg/dL Chest pain:  no Coronary artery disease:  no Family history CAD:  yes Family history early CAD:  yes  DEPRESSION Has taken Zoloft 100 MG for 2 1/2 years, which works well. Prescribed by neurology -- but she reports her neurologist left. Mood status: stable Satisfied with current treatment?: yes Symptom severity: moderate  Duration of current treatment : chronic Side effects: no Medication compliance: good compliance Psychotherapy/counseling: none Depressed mood: none Anxious mood: occasional Anhedonia: no Significant weight loss or gain: no Insomnia: none Fatigue:  no Feelings of worthlessness or guilt: no Impaired concentration/indecisiveness: no Suicidal ideations: no Hopelessness: no Crying spells: no Depression screen [facility] 2/9 02/03/2021 08/01/2020 04/28/2020 01/22/2020 07/26/2019  Decreased Interest 0 0 0 0 0  Down, Depressed, Hopeless 0 0 0 1 0  PHQ - 2 Score 0 0 0 1 0  Altered sleeping 0 0 0 0 0  Tired, decreased energy 0 0 0 0 0  Change in appetite 0 0 0 0 0  Feeling bad or failure about yourself  0 0 0 0 0  Trouble concentrating 0 0 0 0 0  Moving slowly or fidgety/restless 0 0 0 0 0  Suicidal thoughts 0 0 0 0 0  PHQ-9 Score 0 0 0 1 0  Difficult doing work/chores Not difficult at all Not difficult at all - - Not difficult at all      GAD 7 : Generalized Anxiety Score 02/03/2021 07/26/2019  Nervous, Anxious, on Edge 1 0  Control/stop worrying 0 0  Worry too much - different things 0 0  Trouble relaxing 0 0  Restless 0 0  Easily annoyed or irritable 0 1  Afraid - awful might happen 0 0  Total GAD 7 Score 1 1  Anxiety Difficulty Not difficult at all Not difficult at all      Relevant past medical, surgical, family and social history reviewed and updated as indicated. Interim medical history  since our last visit reviewed. Allergies and medications reviewed and updated.  Review of Systems  Constitutional:  Negative for activity change, appetite change, diaphoresis, fatigue and fever.  Respiratory:  Negative for cough, chest tightness and shortness of breath.   Cardiovascular:  Negative for chest pain, palpitations and leg swelling.  Gastrointestinal: Negative.   Genitourinary:  Negative for pelvic pain.  Neurological: Negative.   Psychiatric/Behavioral: Negative.     Per HPI unless specifically indicated above     Objective:    BP 121/83    Pulse 86    Temp 99.2 F (37.3 C) (Oral)    Ht _0  (1.651 m)    Wt 208 lb (94.3 kg)    SpO2 97%    BMI 34.61 kg/m   Wt Readings from Last 3 Encounters:  02/03/21 208 lb (94.3 kg)  08/01/20  203 lb 12.8 oz (92.4 kg)  04/28/20 206 lb 12.8 oz (93.8 kg)    Physical Exam Vitals and nursing note reviewed.  Constitutional:      General: She is awake. She is not in acute distress.    Appearance: She is well-developed and well-groomed. She is obese. She is not ill-appearing or toxic-appearing.  HENT:     Head: Normocephalic.     Right Ear: Hearing normal.     Left Ear: Hearing normal.     Nose: Nose normal.     Mouth/Throat:     Mouth: Mucous membranes are moist.  Eyes:     General: Lids are normal.        Right eye: No discharge.        Left eye: No discharge.     Conjunctiva/sclera: Conjunctivae normal.     Pupils: Pupils are equal, round, and reactive to light.  Neck:     Thyroid: No thyromegaly.     Vascular: No carotid bruit or JVD.  Cardiovascular:     Rate and Rhythm: Normal rate and regular rhythm.     Heart sounds: Normal heart sounds. No murmur heard.   No gallop.  Pulmonary:     Effort: Pulmonary effort is normal. No accessory muscle usage or respiratory distress.     Breath sounds: Normal breath sounds.  Abdominal:     General: Bowel sounds are normal.     Palpations: Abdomen is soft. There is no hepatomegaly or splenomegaly.  Musculoskeletal:     Cervical back: Normal range of motion and neck supple.     Right lower leg: No edema.     Left lower leg: No edema.  Lymphadenopathy:     Cervical: No cervical adenopathy.  Skin:    General: Skin is warm and dry.  Neurological:     Mental Status: She is alert and oriented to person, place, and time.  Psychiatric:        Attention and Perception: Attention normal.        Mood and Affect: Mood normal.        Speech: Speech normal.        Behavior: Behavior normal. Behavior is cooperative.        Thought Content: Thought content normal.    Results for orders placed or performed in visit on 08/01/20  CBC with Differential/Platelet  Result Value Ref Range   WBC 5.2 3.4 - 10.8 x10E3/uL   RBC 3.98 3.77 -  5.28 x10E6/uL   Hemoglobin 12.5 11.1 - 15.9 g/dL   Hematocrit 38.5 34.0 - 46.6 %   MCV 97 79 - 97  fL   MCH 31.4 26.6 - 33.0 pg   MCHC 32.5 31.5 - 35.7 g/dL   RDW 12.3 11.7 - 15.4 %   Platelets 295 150 - 450 x10E3/uL   Neutrophils 54 Not Estab. %   Lymphs 34 Not Estab. %   Monocytes 7 Not Estab. %   Eos 3 Not Estab. %   Basos 2 Not Estab. %   Neutrophils Absolute 2.8 1.4 - 7.0 x10E3/uL   Lymphocytes Absolute 1.8 0.7 - 3.1 x10E3/uL   Monocytes Absolute 0.4 0.1 - 0.9 x10E3/uL   EOS (ABSOLUTE) 0.2 0.0 - 0.4 x10E3/uL   Basophils Absolute 0.1 0.0 - 0.2 x10E3/uL   Immature Granulocytes 0 Not Estab. %   Immature Grans (Abs) 0.0 0.0 - 0.1 x10E3/uL  Comprehensive metabolic panel  Result Value Ref Range   Glucose 88 65 - 99 mg/dL   BUN 13 8 - 27 mg/dL   Creatinine, Ser 0.76 0.57 - 1.00 mg/dL   eGFR 90 >59 mL/min/1.73   BUN/Creatinine Ratio 17 12 - 28   Sodium 141 134 - 144 mmol/L   Potassium 4.5 3.5 - 5.2 mmol/L   Chloride 103 96 - 106 mmol/L   CO2 25 20 - 29 mmol/L   Calcium 9.6 8.7 - 10.3 mg/dL   Total Protein 7.0 6.0 - 8.5 g/dL   Albumin 4.7 3.8 - 4.9 g/dL   Globulin, Total 2.3 1.5 - 4.5 g/dL   Albumin/Globulin Ratio 2.0 1.2 - 2.2   Bilirubin Total 0.4 0.0 - 1.2 mg/dL   Alkaline Phosphatase 58 44 - 121 IU/L   AST 29 0 - 40 IU/L   ALT 19 0 - 32 IU/L  Lipid Panel w/o Chol/HDL Ratio  Result Value Ref Range   Cholesterol, Total 198 100 - 199 mg/dL   Triglycerides 195 (H) 0 - 149 mg/dL   HDL 46 >39 mg/dL   VLDL Cholesterol Cal 34 5 - 40 mg/dL   LDL Chol Calc (NIH) 118 (H) 0 - 99 mg/dL  TSH  Result Value Ref Range   TSH 2.500 0.450 - 4.500 uIU/mL  VITAMIN D 25 Hydroxy (Vit-D Deficiency, Fractures)  Result Value Ref Range   Vit D, 25-Hydroxy 39.4 30.0 - 100.0 ng/mL  T4, free  Result Value Ref Range   Free T4 0.92 0.82 - 1.77 ng/dL      Assessment & Plan:   Problem List Items Addressed This Visit       Other   Depression, major, single episode, mild (HCC) - Primary     Chronic, ongoing.  Continue this regimen and adjust as needed, will take over refilling regimen if needed due to her neurology provider leaving.  Stable on regimen at this time.  Denies SI/HI.        Hypercholesterolemia    Chronic, ongoing.  Started on [place] in July 2022 and is tolerating well.  Poor tolerance to multiple statins and Zetia in past.  Significant family cardiac history.  Continue Repatha at this time and adjust regimen as needed.  Lipid panel and CMP today.      Relevant Orders   Comprehensive metabolic panel   Lipid Panel w/o Chol/HDL Ratio   Myalgia due to statin    Has history of poor tolerance to multiple statins & Zetia, but currently tolerating Repatha well.  Lipid panel today.        Obesity    BMI 34.61.  Recommended eating smaller high protein, low fat meals more frequently and exercising 30 mins  a day 5 times a week with a goal of 10-15lb weight loss in the next 3 months. Patient voiced their understanding and motivation to adhere to these recommendations.         Follow up plan: Return in about 6 months (around 08/03/2021) for Annual physical.

## 2021-02-03 NOTE — Assessment & Plan Note (Signed)
Chronic, ongoing.  Started on Repatha in July 2022 and is tolerating well.  Poor tolerance to multiple statins and Zetia in past.  Significant family cardiac history.  Continue Repatha at this time and adjust regimen as needed.  Lipid panel and CMP today. 

## 2021-02-04 ENCOUNTER — Telehealth: Payer: Self-pay | Admitting: Nurse Practitioner

## 2021-02-04 ENCOUNTER — Encounter: Payer: Self-pay | Admitting: Nurse Practitioner

## 2021-02-04 ENCOUNTER — Other Ambulatory Visit: Payer: Self-pay | Admitting: Nurse Practitioner

## 2021-02-04 DIAGNOSIS — E7800 Pure hypercholesterolemia, unspecified: Secondary | ICD-10-CM

## 2021-02-04 LAB — COMPREHENSIVE METABOLIC PANEL
ALT: 18 IU/L (ref 0–32)
AST: 24 IU/L (ref 0–40)
Albumin/Globulin Ratio: 1.6 (ref 1.2–2.2)
Albumin: 4.8 g/dL (ref 3.8–4.8)
Alkaline Phosphatase: 67 IU/L (ref 44–121)
BUN/Creatinine Ratio: 24 (ref 12–28)
BUN: 16 mg/dL (ref 8–27)
Bilirubin Total: 0.3 mg/dL (ref 0.0–1.2)
CO2: 24 mmol/L (ref 20–29)
Calcium: 9.6 mg/dL (ref 8.7–10.3)
Chloride: 100 mmol/L (ref 96–106)
Creatinine, Ser: 0.66 mg/dL (ref 0.57–1.00)
Globulin, Total: 3 g/dL (ref 1.5–4.5)
Glucose: 106 mg/dL — ABNORMAL HIGH (ref 70–99)
Potassium: 4 mmol/L (ref 3.5–5.2)
Sodium: 138 mmol/L (ref 134–144)
Total Protein: 7.8 g/dL (ref 6.0–8.5)
eGFR: 100 mL/min/{1.73_m2} (ref >59)

## 2021-02-04 LAB — LIPID PANEL W/O CHOL/HDL RATIO
Cholesterol, Total: 261 mg/dL — ABNORMAL HIGH (ref 100–199)
HDL: 51 mg/dL (ref >39)
LDL Chol Calc (NIH): 152 mg/dL — ABNORMAL HIGH (ref 0–99)
Triglycerides: 315 mg/dL — ABNORMAL HIGH (ref 0–149)
VLDL Cholesterol Cal: 58 mg/dL — ABNORMAL HIGH (ref 5–40)

## 2021-02-04 NOTE — Progress Notes (Signed)
Contacted via [place]  -- patient needs lab only visit fasting in morning scheduled please -- this week or next week   Good morning, as we discussed via MyChart message we will repeat these fasting:) Kidney and liver function remain stable.

## 2021-02-04 NOTE — Telephone Encounter (Signed)
Left message asking pt to call back to schedule an appt. °

## 2021-02-04 NOTE — Telephone Encounter (Signed)
Left message asking pt to call back to schedule an appt. Per Jolene, patient needs lab only visit fasting in morning scheduled please -- this week or next week

## 2021-02-13 ENCOUNTER — Other Ambulatory Visit: Payer: Self-pay

## 2021-02-13 ENCOUNTER — Other Ambulatory Visit: Payer: Commercial Managed Care - PPO

## 2021-02-13 DIAGNOSIS — E7800 Pure hypercholesterolemia, unspecified: Secondary | ICD-10-CM

## 2021-02-14 LAB — LIPID PANEL W/O CHOL/HDL RATIO
Cholesterol, Total: 238 mg/dL — ABNORMAL HIGH (ref 100–199)
HDL: 42 mg/dL (ref >39)
LDL Chol Calc (NIH): 137 mg/dL — ABNORMAL HIGH (ref 0–99)
Triglycerides: 325 mg/dL — ABNORMAL HIGH (ref 0–149)
VLDL Cholesterol Cal: 59 mg/dL — ABNORMAL HIGH (ref 5–40)

## 2021-02-14 NOTE — Progress Notes (Signed)
Contacted via MyChart   Good morning Margaret Fuller, your cholesterol labs have returned and are improved from last check but still not at goal.  At this time continue the Repatha injections every two weeks and next visit bring shots in so we can ensure performing them correctly.  Did you tolerate the past Zetia okay?  Or did it give muscle pains too?  May be good to add this on with your Repatha to give a little extra push to lower levels.  Thoughts?  Have a great weekend!! Keep being amazing!!  Thank you for allowing me to participate in your care.  I appreciate you. Kindest regards, Yaritzel Stange

## 2021-03-11 ENCOUNTER — Ambulatory Visit (INDEPENDENT_AMBULATORY_CARE_PROVIDER_SITE_OTHER): Payer: Commercial Managed Care - PPO | Admitting: Obstetrics and Gynecology

## 2021-03-11 ENCOUNTER — Other Ambulatory Visit: Payer: Self-pay

## 2021-03-11 ENCOUNTER — Other Ambulatory Visit (HOSPITAL_COMMUNITY)
Admission: RE | Admit: 2021-03-11 | Discharge: 2021-03-11 | Disposition: A | Discharge status: Home / Self Care | Payer: Commercial Managed Care - PPO | Source: Ambulatory Visit | Attending: Obstetrics and Gynecology | Admitting: Obstetrics and Gynecology

## 2021-03-11 ENCOUNTER — Encounter: Payer: Self-pay | Admitting: Obstetrics and Gynecology

## 2021-03-11 VITALS — BP 120/82 | Ht 63.0 in | Wt 210.0 lb

## 2021-03-11 DIAGNOSIS — R87810 Cervical high risk human papillomavirus (HPV) DNA test positive: Secondary | ICD-10-CM | POA: Insufficient documentation

## 2021-03-11 DIAGNOSIS — Z1211 Encounter for screening for malignant neoplasm of colon: Secondary | ICD-10-CM

## 2021-03-11 DIAGNOSIS — Z124 Encounter for screening for malignant neoplasm of cervix: Secondary | ICD-10-CM

## 2021-03-11 DIAGNOSIS — Z1151 Encounter for screening for human papillomavirus (HPV): Secondary | ICD-10-CM

## 2021-03-11 DIAGNOSIS — Z01419 Encounter for gynecological examination (general) (routine) without abnormal findings: Secondary | ICD-10-CM

## 2021-03-11 DIAGNOSIS — N951 Menopausal and female climacteric states: Secondary | ICD-10-CM

## 2021-03-11 DIAGNOSIS — Z1231 Encounter for screening mammogram for malignant neoplasm of breast: Secondary | ICD-10-CM

## 2021-03-11 MED ORDER — PROGESTERONE MICRONIZED 100 MG PO CAPS: 100.0000 mg | ORAL_CAPSULE | Freq: Every day | ORAL | 3 refills | Status: DC

## 2021-03-11 MED ORDER — ESTRADIOL 0.075 MG/24HR TD PTWK: 0.0750 mg | MEDICATED_PATCH | TRANSDERMAL | 3 refills | Status: DC

## 2021-03-11 NOTE — Progress Notes (Addendum)
PCP: Venita Lick, NP   Chief Complaint  Patient presents with   Gynecologic Exam    HPI:      Margaret Fuller is a 61 y.o. [id number] whose LMP was No LMP recorded. Patient is postmenopausal., presents today for her annual examination.  Her menses are absent due to menopause, no PMB. Has occas vasomotor sx, on HRT. Likes estrogen in gel form but can't get it anymore. Is doing climara patch 0.1 mg and has breast tenderness, as well as prometrium 100 mg QHS.  Wants to continue for now.  Sex activity: single partner, contraception - post menopausal status. She does have vaginal dryness sometimes, improved with lubricants.  Last Pap: 02/14/20 Results were: no abnormalities /POS HPV DNA. Repeat pap due today. No hx of abn paps with tx in past.  Hx of STDs: HPV on pap  Last mammogram: 06/20/20 Results were: normal, repeat in 12 months  There is no FH of breast cancer. There is no FH of ovarian cancer. The patient does do self-breast exams.  Colonoscopy: 2015 at [facility],  Repeat due after ? 10 years, pt to check her records.   Tobacco use: The patient denies current or previous tobacco use. Alcohol use: social drinker No drug use Exercise: mod active  She does get adequate calcium and Vitamin D in her diet.  Labs with PCP.   Patient Active Problem List   Diagnosis Date Noted   Family history of MI (myocardial infarction) 08/07/2020   Family history of hyperlipidemia 08/07/2020   Myalgia due to statin 01/22/2020   History of anemia 07/26/2019   Elevated TSH 07/26/2019   Depression, major, single episode, mild ([place]) 07/26/2019   Obesity 07/26/2019   Osteopenia of neck of right femur    Intractable migraine with aura without status migrainosus    Hypercholesterolemia    Allergic rhinitis    Vocal cord polyp     Past Surgical History:  Procedure Laterality Date   COLONOSCOPY  2015   negative   excision of polyps from vocal cords  1970, 1984, 1985     Family History  Problem Relation Age of Onset   Diabetes Mother    Colon polyps Mother 40   Heart disease Mother        MI has pacemaker   Hypertension Mother    Diabetes Father    Skin cancer Maternal Aunt    Squamous cell carcinoma Sister        skin on arm   Breast cancer Neg Hx     Social History   Socioeconomic History   Marital status: Married    Spouse name: Not on file   Number of children: 1   Years of education: Not on file   Highest education level: Not on file  Occupational History   Occupation: Web designer, Museum/gallery curator  Tobacco Use   Smoking status: Never   Smokeless tobacco: Never  Vaping Use   Vaping Use: Never used  Substance and Sexual Activity   Alcohol use: Yes    Comment: 1/week   Drug use: No   Sexual activity: Yes    Birth control/protection: Post-menopausal  Other Topics Concern   Not on file  Social History Narrative   Daughter graduated from [employer](took theater and communications).   Social Determinants of Health   Financial Resource Strain: Not on file  Food Insecurity: Not on file  Transportation Needs: Not on file  Physical Activity: Not on  file  Stress: Not on file  Social Connections: Not on file  Intimate Partner Violence: Not on file     Current Outpatient Medications:    B Complex Vitamins (VITAMIN-B COMPLEX) TABS, Take by mouth., Disp: , Rfl:    Calcium Carb-Cholecalciferol (CALCIUM CARBONATE-VITAMIN D3) 600-400 MG-UNIT TABS, Take by mouth., Disp: , Rfl:    cetirizine (ZYRTEC) 10 MG tablet, Take by mouth., Disp: , Rfl:    Evolocumab (REPATHA) 140 MG/ML SOSY, Inject 140 mg into the skin every 14 (fourteen) days., Disp: 2.1 mL, Rfl: 5   Misc Natural Products (PETADOLEX 75) 75 MG CAPS, Take by mouth., Disp: , Rfl:    sertraline (ZOLOFT) 100 MG tablet, Take 1 tablet by mouth daily., Disp: , Rfl:    SUMAtriptan (IMITREX) 100 MG tablet, Take 1 tablet by mouth as needed., Disp: , Rfl:     tiZANidine (ZANAFLEX) 4 MG tablet, Take by mouth., Disp: , Rfl:    UBRELVY 100 MG TABS, Take 1 tablet by mouth daily., Disp: , Rfl:    estradiol (CLIMARA - DOSED IN MG/24 HR) 0.075 mg/24hr patch, Place 1 patch (0.075 mg total) onto the skin once a week., Disp: 12 patch, Rfl: 3   progesterone (PROMETRIUM) 100 MG capsule, Take 1 capsule (100 mg total) by mouth daily., Disp: 90 capsule, Rfl: 3     ROS:  Review of Systems  Constitutional:  Negative for fatigue, fever and unexpected weight change.  Respiratory:  Negative for cough, shortness of breath and wheezing.   Cardiovascular:  Negative for chest pain, palpitations and leg swelling.  Gastrointestinal:  Negative for blood in stool, constipation, diarrhea, nausea and vomiting.  Endocrine: Negative for cold intolerance, heat intolerance and polyuria.  Genitourinary:  Negative for dyspareunia, dysuria, flank pain, frequency, genital sores, hematuria, menstrual problem, pelvic pain, urgency, vaginal bleeding, vaginal discharge and vaginal pain.  Musculoskeletal:  Negative for back pain, joint swelling and myalgias.  Skin:  Negative for rash.  Neurological:  Negative for dizziness, syncope, light-headedness, numbness and headaches.  Hematological:  Negative for adenopathy.  Psychiatric/Behavioral:  Negative for agitation, confusion, sleep disturbance and suicidal ideas. The patient is not nervous/anxious.   BREAST: No symptoms    Objective: BP 120/82    Ht 5\' 3"  (1.6 m)    Wt 210 lb (95.3 kg)    BMI 37.20 kg/m    Physical Exam Constitutional:      Appearance: She is well-developed.  Genitourinary:     Vulva normal.     Right Labia: No rash, tenderness or lesions.    Left Labia: No tenderness, lesions or rash.    No vaginal discharge, erythema or tenderness.      Right Adnexa: not tender and no mass present.    Left Adnexa: not tender and no mass present.    No cervical friability or polyp.     Uterus is not enlarged or tender.   Breasts:    Right: No mass, nipple discharge, skin change or tenderness.     Left: No mass, nipple discharge, skin change or tenderness.  Neck:     Thyroid: No thyromegaly.  Cardiovascular:     Rate and Rhythm: Normal rate and regular rhythm.     Heart sounds: Normal heart sounds. No murmur heard. Pulmonary:     Effort: Pulmonary effort is normal.     Breath sounds: Normal breath sounds.  Abdominal:     Palpations: Abdomen is soft.     Tenderness: There is no abdominal tenderness.  There is no guarding or rebound.  Musculoskeletal:        General: Normal range of motion.     Cervical back: Normal range of motion.  Lymphadenopathy:     Cervical: No cervical adenopathy.  Neurological:     General: No focal deficit present.     Mental Status: She is alert and oriented to person, place, and time.     Cranial Nerves: No cranial nerve deficit.  Skin:    General: Skin is warm and dry.  Psychiatric:        Mood and Affect: Mood normal.        Behavior: Behavior normal.        Thought Content: Thought content normal.        Judgment: Judgment normal.  Vitals reviewed.    Assessment/Plan:  Encounter for annual routine gynecological examination  Cervical cancer screening - Plan: Cytology - PAP  Screening for HPV (human papillomavirus) - Plan: Cytology - PAP  Cervical high risk human papillomavirus (HPV) DNA test positive - Plan: Cytology - PAP; repeat pap today. Will f/u with results.   Encounter for screening mammogram for malignant neoplasm of breast - Plan: MM 3D SCREEN BREAST BILATERAL; pt to schedule mammo  Vasomotor symptoms due to menopause - Plan: progesterone (PROMETRIUM) 100 MG capsule, estradiol (CLIMARA - DOSED IN MG/24 HR) 0.075 mg/24hr patch; decrease climara to 0.075 mg dose, Rx RF prometrium. Will cont to wean down ERT as we can. F/u if estrogen gel available again.   Pt to check when colonoscopy due again with [facility].    Meds ordered this encounter   Medications   progesterone (PROMETRIUM) 100 MG capsule    Sig: Take 1 capsule (100 mg total) by mouth daily.    Dispense:  90 capsule    Refill:  3    Order Specific Question:   Supervising Provider    Answer:   Gae Dry [id number]   estradiol (CLIMARA - DOSED IN MG/24 HR) 0.075 mg/24hr patch    Sig: Place 1 patch (0.075 mg total) onto the skin once a week.    Dispense:  12 patch    Refill:  3    Order Specific Question:   Supervising Provider    Answer:   Gae Dry [id number]            GYN counsel breast self exam, mammography screening, menopause, adequate intake of calcium and vitamin D, diet and exercise    F/U  Return in about 1 year (around 03/11/2022).  Breyona Swander B. Milla Wahlberg, PA-C 03/11/2021 5:11 PM

## 2021-03-11 NOTE — Patient Instructions (Signed)
I value your feedback and you entrusting us with your care. If you get a [facility] patient survey, I would appreciate you taking the time to let us know about your experience today. Thank you!  [facility]: [phone]      

## 2021-03-12 ENCOUNTER — Other Ambulatory Visit: Payer: Self-pay | Admitting: Obstetrics and Gynecology

## 2021-03-12 ENCOUNTER — Telehealth: Payer: Self-pay

## 2021-03-12 DIAGNOSIS — N950 Postmenopausal bleeding: Secondary | ICD-10-CM

## 2021-03-12 DIAGNOSIS — N951 Menopausal and female climacteric states: Secondary | ICD-10-CM

## 2021-03-12 MED ORDER — ESTRADIOL 0.075 MG/24HR TD PTWK: 0.0750 mg | MEDICATED_PATCH | TRANSDERMAL | 3 refills | Status: DC

## 2021-03-12 NOTE — Telephone Encounter (Signed)
Rx sent to [facility]. Pls notify [facility] that pt will get from [facility] for now. Thx.

## 2021-03-12 NOTE — Telephone Encounter (Signed)
[facility] in [place] has Rx. Pt is okay with Rx sent there.

## 2021-03-12 NOTE — Progress Notes (Signed)
Rx estradiol to [facility] in [place] since [facility] can't get it.

## 2021-03-12 NOTE — Telephone Encounter (Signed)
Pt aware  Pharmacy aware

## 2021-03-12 NOTE — Telephone Encounter (Signed)
Sherry from [facility] calling; climara 0.075mg  is unavailable and so is the generic; they have Dottie available if [facility] wants to try that or switch to something else.  [phone]

## 2021-03-16 LAB — CYTOLOGY - PAP
Comment: NEGATIVE
Diagnosis: NEGATIVE
High risk HPV: NEGATIVE

## 2021-04-02 ENCOUNTER — Telehealth: Payer: Self-pay

## 2021-04-02 ENCOUNTER — Encounter: Payer: Self-pay | Admitting: Nurse Practitioner

## 2021-04-02 NOTE — Telephone Encounter (Signed)
Prior authorization was initiated via CoverMyMeds for prescription Repatha.  ? ?KEY: [id number] ?

## 2021-04-03 ENCOUNTER — Encounter: Payer: Self-pay | Admitting: Obstetrics and Gynecology

## 2021-04-03 ENCOUNTER — Encounter: Payer: Self-pay | Admitting: Nurse Practitioner

## 2021-04-03 NOTE — Telephone Encounter (Signed)
Patient prior authorization for Repatha was denied.  ?

## 2021-04-06 ENCOUNTER — Other Ambulatory Visit: Payer: Self-pay | Admitting: Obstetrics and Gynecology

## 2021-04-06 DIAGNOSIS — Z1211 Encounter for screening for malignant neoplasm of colon: Secondary | ICD-10-CM

## 2021-04-06 NOTE — Progress Notes (Signed)
Cologuard ref placed.  ?

## 2021-04-07 ENCOUNTER — Telehealth: Payer: Self-pay | Admitting: Nurse Practitioner

## 2021-04-07 NOTE — Telephone Encounter (Signed)
Best contact: [phone] ?Ky calling from [facility] to report that there is already an existing appeal, most recent has been cancelled because there is an authorization on file already. This is for the Repatha Injection  ? ?

## 2021-04-07 NOTE — Telephone Encounter (Signed)
Spoke with [facility] to check the status of patient's current appeal status and was informed by the [facility] representative. Patient was approved on Repatha Injection on 04/04/21. ?

## 2021-04-29 LAB — COLOGUARD: COLOGUARD: NEGATIVE

## 2021-05-06 NOTE — Telephone Encounter (Signed)
Called pt to f/u with results, no answer, LVMTRC. ?

## 2021-05-06 NOTE — Telephone Encounter (Signed)
Called pt to give Cologuard results and she wants to let [facility] know this past Saturday she started having a light period that lasted until yesterday, could the change in dose from patch caused this? No abnormal pain. ?

## 2021-05-06 NOTE — Telephone Encounter (Signed)
Pt aware of results 

## 2021-05-07 NOTE — Telephone Encounter (Signed)
Pls let pt know decreased estrogen dose shouldn't cause bleeding. Let's check GYN u/s in a few wks to make sure nothing going on in uterus. Pls sheds u/s at [facility] to call pt.

## 2021-05-07 NOTE — Addendum Note (Signed)
Addended by: Althea Grimmer B on: 05/07/2021 12:00 PM ? ? Modules accepted: Orders ? ?

## 2021-05-11 NOTE — Telephone Encounter (Signed)
Patient is scheduled ofr 06/04/21. [facility] to Call with results

## 2021-06-04 ENCOUNTER — Other Ambulatory Visit: Payer: Self-pay | Admitting: Obstetrics and Gynecology

## 2021-06-04 ENCOUNTER — Ambulatory Visit (INDEPENDENT_AMBULATORY_CARE_PROVIDER_SITE_OTHER): Payer: Commercial Managed Care - PPO

## 2021-06-04 ENCOUNTER — Telehealth: Payer: Self-pay | Admitting: Obstetrics and Gynecology

## 2021-06-04 DIAGNOSIS — N950 Postmenopausal bleeding: Secondary | ICD-10-CM

## 2021-06-04 DIAGNOSIS — N939 Abnormal uterine and vaginal bleeding, unspecified: Secondary | ICD-10-CM

## 2021-06-04 NOTE — Telephone Encounter (Signed)
Pt aware of neg GYN u/s results for PMB. EM=1.7 mm. Most likely due to atrophy. Sx started after decreasing estrogen dose, still on prog. Small leio. Bleeding has stopped. Reassurance. F/u rpn.

## 2021-06-26 ENCOUNTER — Ambulatory Visit
Admission: RE | Admit: 2021-06-26 | Discharge: 2021-06-26 | Disposition: A | Discharge status: Home / Self Care | Payer: Commercial Managed Care - PPO | Source: Ambulatory Visit | Attending: Obstetrics and Gynecology | Admitting: Obstetrics and Gynecology

## 2021-06-26 DIAGNOSIS — Z1231 Encounter for screening mammogram for malignant neoplasm of breast: Secondary | ICD-10-CM | POA: Diagnosis present

## 2021-08-01 NOTE — Patient Instructions (Signed)

## 2021-08-03 ENCOUNTER — Encounter: Payer: Self-pay | Admitting: Nurse Practitioner

## 2021-08-03 ENCOUNTER — Ambulatory Visit
Admission: RE | Admit: 2021-08-03 | Discharge: 2021-08-03 | Disposition: A | Discharge status: Home / Self Care | Payer: Commercial Managed Care - PPO | Source: Ambulatory Visit | Attending: Nurse Practitioner | Admitting: Nurse Practitioner

## 2021-08-03 ENCOUNTER — Ambulatory Visit
Admission: RE | Admit: 2021-08-03 | Discharge: 2021-08-03 | Disposition: A | Discharge status: Home / Self Care | Payer: Commercial Managed Care - PPO | Source: Home / Self Care | Attending: Nurse Practitioner | Admitting: Nurse Practitioner

## 2021-08-03 ENCOUNTER — Ambulatory Visit (INDEPENDENT_AMBULATORY_CARE_PROVIDER_SITE_OTHER): Payer: Commercial Managed Care - PPO | Admitting: Nurse Practitioner

## 2021-08-03 VITALS — BP 124/72 | HR 70 | Temp 98.1°F | Ht 63.0 in | Wt 208.8 lb

## 2021-08-03 DIAGNOSIS — E7800 Pure hypercholesterolemia, unspecified: Secondary | ICD-10-CM | POA: Diagnosis not present

## 2021-08-03 DIAGNOSIS — T466X5A Adverse effect of antihyperlipidemic and antiarteriosclerotic drugs, initial encounter: Secondary | ICD-10-CM

## 2021-08-03 DIAGNOSIS — M25551 Pain in right hip: Secondary | ICD-10-CM

## 2021-08-03 DIAGNOSIS — M85851 Other specified disorders of bone density and structure, right thigh: Secondary | ICD-10-CM

## 2021-08-03 DIAGNOSIS — E6609 Other obesity due to excess calories: Secondary | ICD-10-CM

## 2021-08-03 DIAGNOSIS — M791 Myalgia, unspecified site: Secondary | ICD-10-CM | POA: Diagnosis not present

## 2021-08-03 DIAGNOSIS — Z8249 Family history of ischemic heart disease and other diseases of the circulatory system: Secondary | ICD-10-CM | POA: Diagnosis not present

## 2021-08-03 DIAGNOSIS — Z83438 Family history of other disorder of lipoprotein metabolism and other lipidemia: Secondary | ICD-10-CM

## 2021-08-03 DIAGNOSIS — Z0000 Encounter for general adult medical examination without abnormal findings: Secondary | ICD-10-CM

## 2021-08-03 DIAGNOSIS — G43119 Migraine with aura, intractable, without status migrainosus: Secondary | ICD-10-CM

## 2021-08-03 DIAGNOSIS — F320 Major depressive disorder, single episode, mild: Secondary | ICD-10-CM

## 2021-08-03 DIAGNOSIS — Z6834 Body mass index (BMI) 34.0-34.9, adult: Secondary | ICD-10-CM

## 2021-08-03 DIAGNOSIS — G8929 Other chronic pain: Secondary | ICD-10-CM | POA: Insufficient documentation

## 2021-08-03 MED ORDER — METHOCARBAMOL 750 MG PO TABS: 750.0000 mg | ORAL_TABLET | Freq: Three times a day (TID) | ORAL | 4 refills | Status: DC | PRN

## 2021-08-03 MED ORDER — REPATHA 140 MG/ML ~~LOC~~ SOSY
140.0000 mg | PREFILLED_SYRINGE | SUBCUTANEOUS | 5 refills | Status: DC
Start: 2021-08-03 — End: 2022-05-25

## 2021-08-03 NOTE — Progress Notes (Signed)
BP 124/72   Pulse 70   Temp 98.1 F (36.7 C) (Oral)   Ht '5\' 3"'  (1.6 m)   Wt 208 lb 12.8 oz (94.7 kg)   SpO2 97%   BMI 36.99 kg/m    Subjective:    Patient ID: Margaret Fuller, female    DOB: 1960/01/25, 61 y.o.   MRN: [id number]  HPI: Margaret Fuller is a 61 y.o. female presenting on 08/03/2021 for comprehensive medical examination. Current medical complaints include:none  She currently lives with: husband Menopausal Symptoms: no  Was switched to [place] and continues Prometrium -- ordered by GYN and follows with, does not like change to patches.  MIGRAINES Followed by neurology with last visit 07/16/21.  Continues on Ubrelvy and Imitrex. They need labs, but she was unable to obtain -- would like in office. Duration: years Onset: gradual Frequency: intermittent Location: right side of head -- behind eye and around Headache duration: with Imitrex helps Radiation: no Time of day headache occurs: varies Alleviating factors: Ubrelvy, Imitrex Aggravating factors: back pain Headache status at time of visit: asymptomatic Treatments attempted: triptans   Aura: yes Nausea:  no Vomiting: no Photophobia:  yes Phonophobia:  yes Effect on social functioning:  no Numbers of missed days of school/work each month: 0 Confusion:  no Gait disturbance/ataxia:  no Behavioral changes:  no Fevers:  no   HYPERLIPIDEMIA Continues on Repatha which has been offering benefit.  Not able to take statins or Zetia due to myalgias, even with 3 or 1 day week dosing.  Significant family history of heart disease, mother just had MI. Hyperlipidemia status: good compliance Satisfied with current treatment?  yes Side effects:  no Medication compliance: good compliance Past cholesterol meds: multiples statins and Zetia Supplements: none Aspirin:  no The 10-year ASCVD risk score (Arnett DK, et al., 2019) is: 4.6%   Values used to calculate the score:     Age: 34 years      Sex: Female     Is Non-Hispanic African American: No     Diabetic: No     Tobacco smoker: No     Systolic Blood Pressure: 557 mmHg     Is BP treated: No     HDL Cholesterol: 42 mg/dL     Total Cholesterol: 238 mg/dL Chest pain:  no Coronary artery disease:  yes Family history CAD:  yes Family history early CAD:  yes   OSTEOPENIA Last DEXA in 2017. Satisfied with current treatment?: yes Adequate calcium & vitamin D: yes Weight bearing exercises: yes   HIP PAIN Present for 2 months -- no recent falls or injuries.  Both sides, but right is worse.  Has been to chiropractor for this. Duration: months Involved hip: bilateral  Mechanism of injury: unknown Location: posterior into buttocks Onset: gradual  Severity: 4/10  Quality: dull, aching, and throbbing Frequency: intermittent Radiation: no Aggravating factors: bending over and walking for prolonged periods   Alleviating factors: Aleeve  Status: fluctuating Treatments attempted: Aleeve   Relief with NSAIDs?: moderate Weakness with weight bearing: no Weakness with walking: no Paresthesias / decreased sensation: no Swelling: no Redness:no Fevers: no   DEPRESSION Continues on Zoloft daily.  Had more stressors recently. Mood status: exacerbated Satisfied with current treatment?: yes Symptom severity: moderate  Duration of current treatment : chronic Side effects: no Medication compliance: good compliance Psychotherapy/counseling: none Depressed mood: yes Anxious mood: yes Anhedonia: no Significant weight loss or gain: no Insomnia: none Fatigue: no Feelings of worthlessness or  guilt: no Impaired concentration/indecisiveness: no Suicidal ideations: no Hopelessness: no Crying spells: yes    08/03/2021   10:20 AM 02/03/2021    4:34 PM 08/01/2020   10:11 AM 04/28/2020   11:03 AM 01/22/2020    1:07 PM  Depression screen PHQ 2/9  Decreased Interest 0 0 0 0 0  Down, Depressed, Hopeless 1 0 0 0 1  PHQ - 2 Score 1 0  0 0 1  Altered sleeping 1 0 0 0 0  Tired, decreased energy 0 0 0 0 0  Change in appetite 0 0 0 0 0  Feeling bad or failure about yourself  0 0 0 0 0  Trouble concentrating 1 0 0 0 0  Moving slowly or fidgety/restless 0 0 0 0 0  Suicidal thoughts 0 0 0 0 0  PHQ-9 Score 3 0 0 0 1  Difficult doing work/chores Not difficult at all Not difficult at all Not difficult at all         08/03/2021   10:20 AM 02/03/2021    4:33 PM 07/26/2019   11:40 AM  GAD 7 : Generalized Anxiety Score  Nervous, Anxious, on Edge 1 1 0  Control/stop worrying 1 0 0  Worry too much - different things 0 0 0  Trouble relaxing 1 0 0  Restless 0 0 0  Easily annoyed or irritable 0 0 1  Afraid - awful might happen 0 0 0  Total GAD 7 Score '3 1 1  ' Anxiety Difficulty Not difficult at all Not difficult at all Not difficult at all        07/26/2019   10:57 AM 01/22/2020    1:07 PM 08/03/2021   10:15 AM 08/03/2021   10:39 AM  Fall Risk  Falls in the past year? 0 0 0 0  Was there an injury with Fall? 0  0 0  Fall Risk Category Calculator 0  0 0  Fall Risk Category Low  Low Low  Patient Fall Risk Level Low fall risk  Low fall risk Low fall risk  Patient at Risk for Falls Due to   No Fall Risks No Fall Risks  Fall risk Follow up   Falls evaluation completed Falls evaluation completed     Functional Status Survey: Is the patient deaf or have difficulty hearing?: No Does the patient have difficulty seeing, even when wearing glasses/contacts?: No Does the patient have difficulty concentrating, remembering, or making decisions?: No Does the patient have difficulty walking or climbing stairs?: No Does the patient have difficulty dressing or bathing?: No Does the patient have difficulty doing errands alone such as visiting a doctor's office or shopping?: No   Past Medical History:  Past Medical History:  Diagnosis Date   Allergic rhinitis    Anemia    Hypercholesterolemia    Migraine    Osteopenia    Vocal cord polyp      Surgical History:  Past Surgical History:  Procedure Laterality Date   COLONOSCOPY  2015   negative   excision of polyps from vocal cords  1970, 1984, 1985    Medications:  Current Outpatient Medications on File Prior to Visit  Medication Sig   B Complex Vitamins (VITAMIN-B COMPLEX) TABS Take by mouth.   Calcium Carb-Cholecalciferol (CALCIUM CARBONATE-VITAMIN D3) 600-400 MG-UNIT TABS Take by mouth.   cetirizine (ZYRTEC) 10 MG tablet Take by mouth.   ELESTRIN 0.52 MG/0.87 GM (0.06%) GEL SMARTSIG:T-DERMAL   estradiol (CLIMARA - DOSED IN MG/24 HR) 0.075  mg/24hr patch Place 1 patch (0.075 mg total) onto the skin once a week.   Misc Natural Products (PETADOLEX 75) 75 MG CAPS Take by mouth.   progesterone (PROMETRIUM) 100 MG capsule Take 1 capsule (100 mg total) by mouth daily.   sertraline (ZOLOFT) 100 MG tablet Take 1 tablet by mouth daily.   SUMAtriptan (IMITREX) 100 MG tablet Take 1 tablet by mouth as needed.   UBRELVY 100 MG TABS Take 1 tablet by mouth daily.   No current facility-administered medications on file prior to visit.    Allergies:  Allergies  Allergen Reactions   Statins Other (See Comments)    Myopathy     Social History:  Social History   Socioeconomic History   Marital status: Married    Spouse name: Not on file   Number of children: 1   Years of education: Not on file   Highest education level: Not on file  Occupational History   Occupation: Web designer, Museum/gallery curator  Tobacco Use   Smoking status: Never   Smokeless tobacco: Never  Vaping Use   Vaping Use: Never used  Substance and Sexual Activity   Alcohol use: Yes    Comment: 1/week   Drug use: No   Sexual activity: Yes    Birth control/protection: Post-menopausal  Other Topics Concern   Not on file  Social History Narrative   Daughter graduated from [employer](took theater and communications).   Social Determinants of Health   Financial Resource Strain: Not on  file  Food Insecurity: Not on file  Transportation Needs: Not on file  Physical Activity: Insufficiently Active (02/03/2018)   Exercise Vital Sign    Days of Exercise per Week: 3 days    Minutes of Exercise per Session: 30 min  Stress: No Stress Concern Present (12/06/2016)   [place]    Feeling of Stress : Only a little  Social Connections: Moderately Integrated (12/06/2016)   Social Connection and Isolation Panel [NHANES]    Frequency of Communication with Friends and Family: Three times a week    Frequency of Social Gatherings with Friends and Family: Once a week    Attends Religious Services: More than 4 times per year    Active Member of [employer] or Organizations: No    Attends Archivist Meetings: Never    Marital Status: Married  Human resources officer Violence: Not At Risk (12/06/2016)   Humiliation, Afraid, Rape, and Kick questionnaire    Fear of Current or Ex-Partner: No    Emotionally Abused: No    Physically Abused: No    Sexually Abused: No   Social History   Tobacco Use  Smoking Status Never  Smokeless Tobacco Never   Social History   Substance and Sexual Activity  Alcohol Use Yes   Comment: 1/week    Family History:  Family History  Problem Relation Age of Onset   Diabetes Mother    Colon polyps Mother 38   Heart disease Mother        MI has pacemaker   Hypertension Mother    Heart attack Mother    Diabetes Father    Squamous cell carcinoma Sister        skin on arm   Skin cancer Maternal Aunt    Breast cancer Neg Hx     Past medical history, surgical history, medications, allergies, family history and social history reviewed with patient today and changes made to appropriate areas  of the chart.   ROS All other ROS negative except what is listed above and in the HPI.      Objective:    BP 124/72   Pulse 70   Temp 98.1 F (36.7 C) (Oral)   Ht '5\' 3"'  (1.6 m)   Wt 208 lb  12.8 oz (94.7 kg)   SpO2 97%   BMI 36.99 kg/m   Wt Readings from Last 3 Encounters:  08/03/21 208 lb 12.8 oz (94.7 kg)  03/11/21 210 lb (95.3 kg)  02/03/21 208 lb (94.3 kg)    Physical Exam Vitals and nursing note reviewed. Exam conducted with a chaperone present.  Constitutional:      General: She is awake. She is not in acute distress.    Appearance: She is well-developed and well-groomed. She is obese. She is not ill-appearing or toxic-appearing.  HENT:     Head: Normocephalic and atraumatic.     Right Ear: Hearing, tympanic membrane, ear canal and external ear normal. No drainage.     Left Ear: Hearing, tympanic membrane, ear canal and external ear normal. No drainage.     Nose: Nose normal.     Right Sinus: No maxillary sinus tenderness or frontal sinus tenderness.     Left Sinus: No maxillary sinus tenderness or frontal sinus tenderness.     Mouth/Throat:     Mouth: Mucous membranes are moist.     Pharynx: Oropharynx is clear. Uvula midline. No pharyngeal swelling, oropharyngeal exudate or posterior oropharyngeal erythema.  Eyes:     General: Lids are normal.        Right eye: No discharge.        Left eye: No discharge.     Extraocular Movements: Extraocular movements intact.     Conjunctiva/sclera: Conjunctivae normal.     Pupils: Pupils are equal, round, and reactive to light.     Visual Fields: Right eye visual fields normal and left eye visual fields normal.  Neck:     Thyroid: No thyromegaly.     Vascular: No carotid bruit.     Trachea: Trachea normal.  Cardiovascular:     Rate and Rhythm: Normal rate and regular rhythm.     Heart sounds: Normal heart sounds. No murmur heard.    No gallop.  Pulmonary:     Effort: Pulmonary effort is normal. No accessory muscle usage or respiratory distress.     Breath sounds: Normal breath sounds.  Abdominal:     General: Bowel sounds are normal.     Palpations: Abdomen is soft. There is no hepatomegaly or splenomegaly.      Tenderness: There is no abdominal tenderness.  Musculoskeletal:        General: Normal range of motion.     Cervical back: Normal range of motion and neck supple.     Right lower leg: No edema.     Left lower leg: No edema.  Lymphadenopathy:     Head:     Right side of head: No submental, submandibular, tonsillar, preauricular or posterior auricular adenopathy.     Left side of head: No submental, submandibular, tonsillar, preauricular or posterior auricular adenopathy.     Cervical: No cervical adenopathy.  Skin:    General: Skin is warm and dry.     Capillary Refill: Capillary refill takes less than 2 seconds.     Findings: No rash.  Neurological:     Mental Status: She is alert and oriented to person, place, and time.  Gait: Gait is intact.     Deep Tendon Reflexes: Reflexes are normal and symmetric.     Reflex Scores:      Brachioradialis reflexes are 2+ on the right side and 2+ on the left side.      Patellar reflexes are 2+ on the right side and 2+ on the left side. Psychiatric:        Attention and Perception: Attention normal.        Mood and Affect: Mood normal.        Speech: Speech normal.        Behavior: Behavior normal. Behavior is cooperative.        Thought Content: Thought content normal.        Judgment: Judgment normal.    Results for orders placed or performed in visit on 04/06/21  Cologuard  Result Value Ref Range   COLOGUARD Negative Negative      Assessment & Plan:   Problem List Items Addressed This Visit       Cardiovascular and Mediastinum   Intractable migraine with aura without status migrainosus    Chronic, ongoing.  Followed by neurology, continue this collaboration and current medication regimen as prescribed by them.  Will obtain the labs they requested and fax to their office. Return in 6 months.      Relevant Medications   methocarbamol (ROBAXIN-750) 750 MG tablet   Evolocumab (REPATHA) 140 MG/ML SOSY   Other Relevant Orders    Vitamin B12   Vitamin B1   Vitamin B6   Sed Rate (ESR)   Protein Electrophoresis, (serum)   Folate   HgB A1c     Musculoskeletal and Integument   Osteopenia of neck of right femur    Noted on DEXA in 2017 with T -1.6.  Continue daily supplements, Calcium and Vitamin D.  Plan to repeat DEXA in September 2024.  Check Vit D level today.      Relevant Orders   VITAMIN D 25 Hydroxy (Vit-D Deficiency, Fractures)     Other   Depression, major, single episode, mild (HCC) - Primary    Chronic, ongoing.  Continue this regimen and adjust as needed.  Stable on regimen at this time.  Denies SI/HI.  Return in 6 months.      Family history of hyperlipidemia    Will continue Repatha as is offering benefit to her.      Family history of MI (myocardial infarction)    Will continue Repatha, as this is offering benefit and can not take statins.      Relevant Orders   Lipid Panel w/o Chol/HDL Ratio   Hypercholesterolemia    Chronic, ongoing.  Started on [place] in July 2022 and is tolerating well.  Poor tolerance to multiple statins and Zetia in past.  Significant family cardiac history.  Continue Repatha at this time and adjust regimen as needed.  Lipid panel and CMP today.      Relevant Medications   Evolocumab (REPATHA) 140 MG/ML SOSY   Other Relevant Orders   Comprehensive metabolic panel   Lipid Panel w/o Chol/HDL Ratio   Myalgia due to statin    Has history of poor tolerance to multiple statins & Zetia, but currently tolerating Repatha well.  Lipid panel today.        Obesity    BMI 36.99.  Recommended eating smaller high protein, low fat meals more frequently and exercising 30 mins a day 5 times a week with a goal of 10-15lb weight  loss in the next 3 months. Patient voiced their understanding and motivation to adhere to these recommendations.       Right hip pain    For 2 months, will obtain imaging of lumbar spine and right hip.  Suspect OA or degenerative changes present.   Recommend use of Voltaren gel or Tylenol as needed at home + heat.  Will determine next steps after imaging returns.  May benefit PT time.      Relevant Orders   DG Hip Unilat W OR W/O Pelvis 2-3 Views Right   DG Lumbar Spine Complete   Other Visit Diagnoses     Encounter for annual physical exam       Annual physical today with labs and health maintenance reviewed, discussed with patient.   Relevant Orders   CBC with Differential/Platelet   TSH        Follow up plan: Return in about 6 months (around 02/03/2022) for HLD, MIGRAINES, MOOD.   LABORATORY TESTING:  - Pap smear: up to date  IMMUNIZATIONS:   - Tdap: Tetanus vaccination status reviewed: last tetanus booster within 10 years. - Influenza: Up to date - Pneumovax: Not applicable - Prevnar: Not applicable - COVID: Up to date - HPV: Not applicable - Shingrix vaccine: Up to date  SCREENING: -Mammogram: Up to date  - Colonoscopy: Up to date  - Bone Density: Not applicable  -Hearing Test: Not applicable  -Spirometry: Not applicable   PATIENT COUNSELING:   Advised to take 1 mg of folate supplement per day if capable of pregnancy.   Sexuality: Discussed sexually transmitted diseases, partner selection, use of condoms, avoidance of unintended pregnancy  and contraceptive alternatives.   Advised to avoid cigarette smoking.  I discussed with the patient that most people either abstain from alcohol or drink within safe limits (<=14/week and <=4 drinks/occasion for males, <=7/weeks and <= 3 drinks/occasion for females) and that the risk for alcohol disorders and other health effects rises proportionally with the number of drinks per week and how often a drinker exceeds daily limits.  Discussed cessation/primary prevention of drug use and availability of treatment for abuse.   Diet: Encouraged to adjust caloric intake to maintain  or achieve ideal body weight, to reduce intake of dietary saturated fat and total fat, to  limit sodium intake by avoiding high sodium foods and not adding table salt, and to maintain adequate dietary potassium and calcium preferably from fresh fruits, vegetables, and low-fat dairy products.    Stressed the importance of regular exercise  Injury prevention: Discussed safety belts, safety helmets, smoke detector, smoking near bedding or upholstery.   Dental health: Discussed importance of regular tooth brushing, flossing, and dental visits.    NEXT PREVENTATIVE PHYSICAL DUE IN 1 YEAR. Return in about 6 months (around 02/03/2022) for HLD, MIGRAINES, MOOD.

## 2021-08-03 NOTE — Assessment & Plan Note (Signed)
BMI 36.99.  Recommended eating smaller high protein, low fat meals more frequently and exercising 30 mins a day 5 times a week with a goal of 10-15lb weight loss in the next 3 months. Patient voiced their understanding and motivation to adhere to these recommendations.  

## 2021-08-03 NOTE — Assessment & Plan Note (Signed)
Will continue Repatha as is offering benefit to her.

## 2021-08-03 NOTE — Assessment & Plan Note (Signed)
Chronic, ongoing.  Started on Repatha in July 2022 and is tolerating well.  Poor tolerance to multiple statins and Zetia in past.  Significant family cardiac history.  Continue Repatha at this time and adjust regimen as needed.  Lipid panel and CMP today.

## 2021-08-03 NOTE — Assessment & Plan Note (Signed)
For 2 months, will obtain imaging of lumbar spine and right hip.  Suspect OA or degenerative changes present.  Recommend use of Voltaren gel or Tylenol as needed at home + heat.  Will determine next steps after imaging returns.  May benefit PT time.

## 2021-08-03 NOTE — Assessment & Plan Note (Signed)
Chronic, ongoing.  Continue this regimen and adjust as needed.  Stable on regimen at this time.  Denies SI/HI.  Return in 6 months. 

## 2021-08-03 NOTE — Progress Notes (Signed)
Contacted via MyChart   Good evening Margaret Fuller, I have lower back imaging but not hip yet.  Lower back is showing some degenerative disc to lower aspect, this could be causing some of the discomfort if a nerve is getting pushed on.  We will see what hip imaging shows.  Any questions?

## 2021-08-03 NOTE — Assessment & Plan Note (Signed)
Noted on DEXA in 2017 with T -1.6.  Continue daily supplements, Calcium and Vitamin D.  Plan to repeat DEXA in September 2024.  Check Vit D level today.

## 2021-08-03 NOTE — Assessment & Plan Note (Signed)
Has history of poor tolerance to multiple statins & Zetia, but currently tolerating Repatha well.  Lipid panel today.   

## 2021-08-03 NOTE — Assessment & Plan Note (Signed)
Will continue Repatha, as this is offering benefit and can not take statins. 

## 2021-08-03 NOTE — Assessment & Plan Note (Signed)
Chronic, ongoing.  Followed by neurology, continue this collaboration and current medication regimen as prescribed by them.  Will obtain the labs they requested and fax to their office. Return in 6 months.

## 2021-08-04 ENCOUNTER — Encounter: Payer: Self-pay | Admitting: Nurse Practitioner

## 2021-08-04 DIAGNOSIS — Z83438 Family history of other disorder of lipoprotein metabolism and other lipidemia: Secondary | ICD-10-CM

## 2021-08-04 DIAGNOSIS — Z8249 Family history of ischemic heart disease and other diseases of the circulatory system: Secondary | ICD-10-CM

## 2021-08-04 DIAGNOSIS — T466X5A Adverse effect of antihyperlipidemic and antiarteriosclerotic drugs, initial encounter: Secondary | ICD-10-CM

## 2021-08-04 DIAGNOSIS — E7800 Pure hypercholesterolemia, unspecified: Secondary | ICD-10-CM

## 2021-08-04 NOTE — Progress Notes (Signed)
Contacted via MyChart   Good morning Margaret Fuller, your hip imaging returned and no arthritis noted.  There is thinning of bone, osteopenia, but no arthritic changes.  I suspect the discomfort you are having may be coming more from your back.  You would benefit from physical therapy if this continues to cause discomfort.  Let me know if you are interested in this.:)

## 2021-08-04 NOTE — Progress Notes (Signed)
Contacted via MyChart   Good afternoon Sumaya, your labs have returned, some are still pending and I will send all of them the neurology once completed.  Overall they are stable, but cholesterol levels are still above goal with Repatha. I do think if would be beneficial to send you to a lipid specialist in [place] [facility] for further testing and work-up + recommendations for this with you family history.  Would you be okay with this referral?  Any questions? Keep being amazing!!  Thank you for allowing me to participate in your care.  I appreciate you. Kindest regards, Karlissa Aron

## 2021-08-06 NOTE — Progress Notes (Signed)
Please print and fax all recent labs to neurology [facility] [facility] as they had requested multiple of these.  Thank you:)

## 2021-08-10 LAB — CBC WITH DIFFERENTIAL/PLATELET
Basophils Absolute: 0.1 10*3/uL (ref 0.0–0.2)
Basos: 2 %
EOS (ABSOLUTE): 0.1 10*3/uL (ref 0.0–0.4)
Eos: 2 %
Hematocrit: 37.9 % (ref 34.0–46.6)
Hemoglobin: 12.2 g/dL (ref 11.1–15.9)
Immature Grans (Abs): 0 10*3/uL (ref 0.0–0.1)
Immature Granulocytes: 0 %
Lymphocytes Absolute: 1.8 10*3/uL (ref 0.7–3.1)
Lymphs: 33 %
MCH: 31.2 pg (ref 26.6–33.0)
MCHC: 32.2 g/dL (ref 31.5–35.7)
MCV: 97 fL (ref 79–97)
Monocytes Absolute: 0.3 10*3/uL (ref 0.1–0.9)
Monocytes: 5 %
Neutrophils Absolute: 3.1 10*3/uL (ref 1.4–7.0)
Neutrophils: 58 %
Platelets: 326 10*3/uL (ref 150–450)
RBC: 3.91 x10E6/uL (ref 3.77–5.28)
RDW: 12.2 % (ref 11.7–15.4)
WBC: 5.3 10*3/uL (ref 3.4–10.8)

## 2021-08-10 LAB — COMPREHENSIVE METABOLIC PANEL
ALT: 18 IU/L (ref 0–32)
AST: 27 IU/L (ref 0–40)
Albumin/Globulin Ratio: 1.7 (ref 1.2–2.2)
Albumin: 4.6 g/dL (ref 3.9–4.9)
Alkaline Phosphatase: 62 IU/L (ref 44–121)
BUN/Creatinine Ratio: 17 (ref 12–28)
BUN: 12 mg/dL (ref 8–27)
Bilirubin Total: 0.4 mg/dL (ref 0.0–1.2)
CO2: 23 mmol/L (ref 20–29)
Calcium: 9.3 mg/dL (ref 8.7–10.3)
Chloride: 103 mmol/L (ref 96–106)
Creatinine, Ser: 0.72 mg/dL (ref 0.57–1.00)
Globulin, Total: 2.7 g/dL (ref 1.5–4.5)
Glucose: 92 mg/dL (ref 70–99)
Potassium: 4.3 mmol/L (ref 3.5–5.2)
Sodium: 139 mmol/L (ref 134–144)
Total Protein: 7.3 g/dL (ref 6.0–8.5)
eGFR: 95 mL/min/{1.73_m2} (ref >59)

## 2021-08-10 LAB — SEDIMENTATION RATE: Sed Rate: 20 mm/hr (ref 0–40)

## 2021-08-10 LAB — LIPID PANEL W/O CHOL/HDL RATIO
Cholesterol, Total: 208 mg/dL — ABNORMAL HIGH (ref 100–199)
HDL: 44 mg/dL (ref >39)
LDL Chol Calc (NIH): 126 mg/dL — ABNORMAL HIGH (ref 0–99)
Triglycerides: 215 mg/dL — ABNORMAL HIGH (ref 0–149)
VLDL Cholesterol Cal: 38 mg/dL (ref 5–40)

## 2021-08-10 LAB — PROTEIN ELECTROPHORESIS, SERUM
A/G Ratio: 1.2 (ref 0.7–1.7)
Albumin ELP: 4 g/dL (ref 2.9–4.4)
Alpha 1: 0.2 g/dL (ref 0.0–0.4)
Alpha 2: 0.9 g/dL (ref 0.4–1.0)
Beta: 1.1 g/dL (ref 0.7–1.3)
Gamma Globulin: 1.1 g/dL (ref 0.4–1.8)
Globulin, Total: 3.3 g/dL (ref 2.2–3.9)

## 2021-08-10 LAB — VITAMIN B1: Thiamine: 242.7 nmol/L — ABNORMAL HIGH (ref 66.5–200.0)

## 2021-08-10 LAB — FOLATE: Folate: >20 ng/mL (ref >3.0)

## 2021-08-10 LAB — TSH: TSH: 4.01 u[IU]/mL (ref 0.450–4.500)

## 2021-08-10 LAB — VITAMIN D 25 HYDROXY (VIT D DEFICIENCY, FRACTURES): Vit D, 25-Hydroxy: 36 ng/mL (ref 30.0–100.0)

## 2021-08-10 LAB — HEMOGLOBIN A1C
Est. average glucose Bld gHb Est-mCnc: 114 mg/dL
Hgb A1c MFr Bld: 5.6 % (ref 4.8–5.6)

## 2021-08-10 LAB — VITAMIN B12: Vitamin B-12: 1267 pg/mL — ABNORMAL HIGH (ref 232–1245)

## 2021-08-10 LAB — VITAMIN B6: Vitamin B6: 26.7 ug/L (ref 3.4–65.2)

## 2021-09-07 ENCOUNTER — Encounter: Payer: Self-pay | Admitting: Internal Medicine

## 2021-09-22 ENCOUNTER — Encounter: Payer: Self-pay | Admitting: Nurse Practitioner

## 2022-01-05 ENCOUNTER — Ambulatory Visit (HOSPITAL_BASED_OUTPATIENT_CLINIC_OR_DEPARTMENT_OTHER): Payer: Commercial Managed Care - PPO | Admitting: Internal Medicine

## 2022-01-05 ENCOUNTER — Encounter (HOSPITAL_BASED_OUTPATIENT_CLINIC_OR_DEPARTMENT_OTHER): Payer: Self-pay | Admitting: Internal Medicine

## 2022-01-05 VITALS — BP 133/75 | HR 82 | Ht 63.0 in | Wt 214.0 lb

## 2022-01-05 DIAGNOSIS — M791 Myalgia, unspecified site: Secondary | ICD-10-CM | POA: Diagnosis not present

## 2022-01-05 DIAGNOSIS — E7800 Pure hypercholesterolemia, unspecified: Secondary | ICD-10-CM | POA: Diagnosis not present

## 2022-01-05 DIAGNOSIS — T466X5A Adverse effect of antihyperlipidemic and antiarteriosclerotic drugs, initial encounter: Secondary | ICD-10-CM

## 2022-01-05 MED ORDER — EZETIMIBE 10 MG PO TABS: 10.0000 mg | ORAL_TABLET | Freq: Every day | ORAL | 3 refills | Status: DC

## 2022-01-05 NOTE — Progress Notes (Signed)
LIPID CLINIC CONSULT NOTE  Chief Complaint:  Manage dyslipidemia  Primary Care Physician: Marjie Skiff, NP  Primary Cardiologist:  None  HPI:  Margaret Fuller is a 61 y.o. female who is being seen today for the evaluation of dyslipidemia at the request of Marjie Skiff, NP.  This is a pleasant 61 year old female kindly referred for evaluation management of dyslipidemia.  She has a strong family history of heart disease mostly on her mother side including her mom who has had 2 prior MIs and has a pacemaker and various other family members with high cholesterol.  In the past her cholesterol was quite high with a total cholesterol 279, triglycerides 154, HDL 54 and LDL 197 (off therapy).  Subsequently she has been started on Repatha since she was intolerant of statins.  This is resulted in some improvement of her cholesterol.  Total now 208, triglycerides 215, HDL 44 and LDL 126.  This is less than the expected 60 to 65% reduction in LDL cholesterol that we typically see with this medication.  She reports compliance with every 2-week dosing.  Overall is well-tolerated without the side effects that were seen with the statins.  That being said, I would expect her target LDL to be less than 70.  We do not know if she has any cardiovascular disease but she is suspected to have a familial hyperlipidemia.  She reports she has made efforts to lower saturated fats in her diet and is working to lose weight.  PMHx:  Past Medical History:  Diagnosis Date   Allergic rhinitis    Anemia    Hypercholesterolemia    Migraine    Osteopenia    Vocal cord polyp     Past Surgical History:  Procedure Laterality Date   COLONOSCOPY  2015   negative   excision of polyps from vocal cords  1970, 1984, 1985    FAMHx:  Family History  Problem Relation Age of Onset   Diabetes Mother    Colon polyps Mother 21   Heart disease Mother        MI has pacemaker   Hypertension Mother     Heart attack Mother    Diabetes Father    Squamous cell carcinoma Sister        skin on arm   Skin cancer Maternal Aunt    Breast cancer Neg Hx     SOCHx:   reports that she has never smoked. She has never used smokeless tobacco. She reports current alcohol use. She reports that she does not use drugs.  ALLERGIES:  Allergies  Allergen Reactions   Statins Other (See Comments)    Myopathy     ROS: Pertinent items noted in HPI and remainder of comprehensive ROS otherwise negative.  HOME MEDS: Current Outpatient Medications on File Prior to Visit  Medication Sig Dispense Refill   B Complex Vitamins (VITAMIN-B COMPLEX) TABS Take by mouth.     Calcium Carb-Cholecalciferol (CALCIUM CARBONATE-VITAMIN D3) 600-400 MG-UNIT TABS Take by mouth.     cetirizine (ZYRTEC) 10 MG tablet Take by mouth.     diphenhydrAMINE-APAP, sleep, (TYLENOL PM EXTRA STRENGTH PO) Take by mouth at bedtime as needed.     ELESTRIN 0.52 MG/0.87 GM (0.06%) GEL SMARTSIG:T-DERMAL     Esomeprazole Magnesium (NEXIUM PO) Take by mouth daily as needed.     Evolocumab (REPATHA) 140 MG/ML SOSY Inject 140 mg into the skin every 14 (fourteen) days. 2.1 mL 5   Misc Natural  Products (PETADOLEX 75) 75 MG CAPS Take by mouth.     Omega-3 Fatty Acids (FISH OIL) 1200 MG CAPS Take by mouth daily.     progesterone (PROMETRIUM) 100 MG capsule Take 1 capsule (100 mg total) by mouth daily. 90 capsule 3   Sennosides (SENOKOT PO) Take by mouth daily as needed.     sertraline (ZOLOFT) 100 MG tablet Take 1 tablet by mouth daily.     SUMAtriptan (IMITREX) 100 MG tablet Take 1 tablet by mouth as needed.     tretinoin (RETIN-A) 0.025 % cream Apply 1 Application topically at bedtime.     UBRELVY 100 MG TABS Take 1 tablet by mouth daily.     No current facility-administered medications on file prior to visit.    LABS/IMAGING: No results found for this or any previous visit (from the past 48 hour(s)). No results found.  LIPID PANEL:     Component Value Date/Time   CHOL 208 (H) 08/03/2021 1053   TRIG 215 (H) 08/03/2021 1053   HDL 44 08/03/2021 1053   CHOLHDL 5.3 (H) 08/27/2019 0837   LDLCALC 126 (H) 08/03/2021 1053    WEIGHTS: Wt Readings from Last 3 Encounters:  01/05/22 214 lb (97.1 kg)  08/03/21 208 lb 12.8 oz (94.7 kg)  03/11/21 210 lb (95.3 kg)    VITALS: BP 133/75   Pulse 82   Ht 5\' 3"  (1.6 m)   Wt 214 lb (97.1 kg)   BMI 37.91 kg/m   EXAM: Deferred  EKG: Deferred  ASSESSMENT: Probable familial hyperlipidemia, LDL greater than 190 untreated, by criteria Multiple family members including her mother with coronary artery disease and high cholesterol Statin intolerance-myalgias Less than expected response to Repatha  PLAN: 1.   Ms. Hund has probable familial hyperlipidemia with untreated LDL cholesterol over 190.  She is tolerating Repatha but cannot take statins.  Her LDL is still well above target LDL less than 70.  She will likely need additional therapy.  I would advise starting ezetimibe 10 mg daily.  Will need to check an LP(a) which may explain why she did not reach target.  In addition I think would be helpful to get a coronary calcium score to determine the extent and/or severity of her coronary disease if she has it.  She is agreeable to this plan.  Will plan repeat lipids in about 3 to 4 months and follow-up with me afterwards.  Thanks again for the kind referral.  Dorothey Baseman, MD, [facility]  [place]  [place] [facility]  Medical Director of the [facility] Lipid [facility] &  Cardiovascular Risk Reduction Clinic Diplomate of the American Board of Clinical Lipidology Attending Cardiologist  Direct Dial: [phone]  Fax: [phone]  Website:  [email or web address] Marlei Glomski 01/05/2022, 10:08 AM

## 2022-01-05 NOTE — Patient Instructions (Signed)
Medication Instructions:  START zetia 10mg  daily  CONTINUE all other current medications  *If you need a refill on your cardiac medications before your next appointment, please call your pharmacy*   Lab Work: LPa today   FASTING NMR lipoprofile in 3-4 months -- complete before next appointment   If you have labs (blood work) drawn today and your tests are completely normal, you will receive your results only by: MyChart Message (if you have MyChart) OR A paper copy in the mail If you have any lab test that is abnormal or we need to change your treatment, we will call you to review the results.   Testing/Procedures: Dr. has ordered a CT coronary calcium score.   Test locations:  [facility]  This is $99 out of pocket.   Coronary CalciumScan A coronary calcium scan is an imaging test used to look for deposits of calcium and other fatty materials (plaques) in the inner lining of the blood vessels of the heart (coronary arteries). These deposits of calcium and plaques can partly clog and narrow the coronary arteries without producing any symptoms or warning signs. This puts a person at risk for a heart attack. This test can detect these deposits before symptoms develop. Tell a health care provider about: Any allergies you have. All medicines you are taking, including vitamins, herbs, eye drops, creams, and over-the-counter medicines. Any problems you or family members have had with anesthetic medicines. Any blood disorders you have. Any surgeries you have had. Any medical conditions you have. Whether you are pregnant or may be pregnant. What are the risks? Generally, this is a safe procedure. However, problems may occur, including: Harm to a pregnant woman and her unborn baby. This test involves the use of radiation. Radiation exposure can be dangerous to a pregnant woman and her unborn baby. If you are pregnant, you generally should not  have this procedure done. Slight increase in the risk of cancer. This is because of the radiation involved in the test. What happens before the procedure? No preparation is needed for this procedure. What happens during the procedure? You will undress and remove any jewelry around your neck or chest. You will put on a hospital gown. Sticky electrodes will be placed on your chest. The electrodes will be connected to an electrocardiogram (ECG) machine to record a tracing of the electrical activity of your heart. A CT scanner will take pictures of your heart. During this time, you will be asked to lie still and hold your breath for 2-3 seconds while a picture of your heart is being taken. The procedure may vary among health care providers and hospitals. What happens after the procedure? You can get dressed. You can return to your normal activities. It is up to you to get the results of your test. Ask your health care provider, or the department that is doing the test, when your results will be ready. Summary A coronary calcium scan is an imaging test used to look for deposits of calcium and other fatty materials (plaques) in the inner lining of the blood vessels of the heart (coronary arteries). Generally, this is a safe procedure. Tell your health care provider if you are pregnant or may be pregnant. No preparation is needed for this procedure. A CT scanner will take pictures of your heart. You can return to your normal activities after the scan is done. This information is not intended to replace advice given to you by  your health care provider. Make sure you discuss any questions you have with your health care provider. Document Released: 07/03/2007 Document Revised: 11/24/2015 Document Reviewed: 11/24/2015 Elsevier Interactive Patient Education  2017 [employer].    Follow-Up: At [facility], you and your health needs are our priority.  As part of our continuing mission to  provide you with exceptional heart care, we have created designated Provider Care Teams.  These Care Teams include your primary Cardiologist (physician) and Advanced Practice Providers (APPs -  Physician Assistants and Nurse Practitioners) who all work together to provide you with the care you need, when you need it.  We recommend signing up for the patient portal called "MyChart".  Sign up information is provided on this After Visit Summary.  MyChart is used to connect with patients for Virtual Visits (Telemedicine).  Patients are able to view lab/test results, encounter notes, upcoming appointments, etc.  Non-urgent messages can be sent to your provider as well.   To learn more about what you can do with MyChart, go to [email or web address].    Your next appointment:    3-4 months with Dr. Rennis Golden -- lipid clinic

## 2022-01-06 LAB — LIPOPROTEIN A (LPA): Lipoprotein (a): 341.7 nmol/L — ABNORMAL HIGH (ref <75.0)

## 2022-01-13 ENCOUNTER — Ambulatory Visit
Admission: RE | Admit: 2022-01-13 | Discharge: 2022-01-13 | Disposition: A | Discharge status: Home / Self Care | Payer: Self-pay | Source: Ambulatory Visit | Attending: Internal Medicine | Admitting: Internal Medicine

## 2022-01-13 DIAGNOSIS — E7800 Pure hypercholesterolemia, unspecified: Secondary | ICD-10-CM

## 2022-01-20 ENCOUNTER — Encounter: Payer: Self-pay | Admitting: Internal Medicine

## 2022-01-28 ENCOUNTER — Other Ambulatory Visit: Payer: Self-pay | Admitting: Obstetrics

## 2022-01-28 ENCOUNTER — Encounter: Payer: Self-pay | Admitting: Obstetrics and Gynecology

## 2022-01-30 NOTE — Patient Instructions (Signed)

## 2022-01-31 ENCOUNTER — Other Ambulatory Visit: Payer: Self-pay | Admitting: Obstetrics and Gynecology

## 2022-01-31 MED ORDER — ELESTRIN 0.52 MG/0.87 GM (0.06%) TD GEL: TRANSDERMAL | 0 refills | Status: DC

## 2022-01-31 NOTE — Progress Notes (Signed)
Rx  elestrin gel instead of climara patch. Had changed to patch due to unavailability of gel last yr

## 2022-02-01 ENCOUNTER — Encounter: Payer: Self-pay | Admitting: Nurse Practitioner

## 2022-02-01 ENCOUNTER — Ambulatory Visit: Payer: Commercial Managed Care - PPO | Admitting: Nurse Practitioner

## 2022-02-01 VITALS — BP 118/84 | HR 87 | Temp 97.9°F | Ht 62.99 in | Wt 210.5 lb

## 2022-02-01 DIAGNOSIS — E6609 Other obesity due to excess calories: Secondary | ICD-10-CM

## 2022-02-01 DIAGNOSIS — Z8249 Family history of ischemic heart disease and other diseases of the circulatory system: Secondary | ICD-10-CM

## 2022-02-01 DIAGNOSIS — Z6834 Body mass index (BMI) 34.0-34.9, adult: Secondary | ICD-10-CM

## 2022-02-01 DIAGNOSIS — E7800 Pure hypercholesterolemia, unspecified: Secondary | ICD-10-CM | POA: Diagnosis not present

## 2022-02-01 DIAGNOSIS — M791 Myalgia, unspecified site: Secondary | ICD-10-CM

## 2022-02-01 DIAGNOSIS — T466X5A Adverse effect of antihyperlipidemic and antiarteriosclerotic drugs, initial encounter: Secondary | ICD-10-CM

## 2022-02-01 DIAGNOSIS — F320 Major depressive disorder, single episode, mild: Secondary | ICD-10-CM | POA: Diagnosis not present

## 2022-02-01 DIAGNOSIS — M5441 Lumbago with sciatica, right side: Secondary | ICD-10-CM

## 2022-02-01 DIAGNOSIS — M85851 Other specified disorders of bone density and structure, right thigh: Secondary | ICD-10-CM

## 2022-02-01 DIAGNOSIS — G43119 Migraine with aura, intractable, without status migrainosus: Secondary | ICD-10-CM | POA: Diagnosis not present

## 2022-02-01 DIAGNOSIS — G8929 Other chronic pain: Secondary | ICD-10-CM

## 2022-02-01 NOTE — Assessment & Plan Note (Signed)
Will continue Repatha, as this is offering benefit and can not take statins.

## 2022-02-01 NOTE — Assessment & Plan Note (Signed)
BMI 37.30.  Recommended eating smaller high protein, low fat meals more frequently and exercising 30 mins a day 5 times a week with a goal of 10-15lb weight loss in the next 3 months. Patient voiced their understanding and motivation to adhere to these recommendations.

## 2022-02-01 NOTE — Assessment & Plan Note (Signed)
Chronic, ongoing.  Known degenerative changes lumbar spine on imaging.  Recommend she start Tylenol 1000 MG daily and look into physical therapy exercises online - perform these daily.  Continue current therapy + add on TENS machine for discomfort.  Consider Physical therapy outpatient if ongoing or worsening.

## 2022-02-01 NOTE — Assessment & Plan Note (Addendum)
Chronic, ongoing.  Started on [place] in July 2022 and is tolerating well with benefit.  Cardiology recently added on Zetia.  Poor tolerance to multiple statins in past.  Significant family cardiac history, mother had MI in 59's and without medication patient LDL >190.  Continue Repatha & Zetia at this time and adjust regimen as needed.  Lipid panel and CMP today.  Continue collaboration with lipid specialist, recent note and imagine reviewed.

## 2022-02-01 NOTE — Assessment & Plan Note (Signed)
Chronic, ongoing.  Continue this regimen and adjust as needed.  Stable on regimen at this time.  Denies SI/HI.  Return in 6 months.

## 2022-02-01 NOTE — Assessment & Plan Note (Signed)
Chronic, ongoing.  Followed by neurology, continue this collaboration and current medication regimen as prescribed by them.  She will call to schedule follow-up with them.  Return in 6 months.
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
## 2022-02-01 NOTE — Assessment & Plan Note (Signed)
Has history of poor tolerance to multiple statins & Zetia, but currently tolerating Repatha well.  Lipid panel today.

## 2022-02-02 LAB — COMPREHENSIVE METABOLIC PANEL
ALT: 18 IU/L (ref 0–32)
AST: 26 IU/L (ref 0–40)
Albumin/Globulin Ratio: 1.7 (ref 1.2–2.2)
Albumin: 4.8 g/dL (ref 3.9–4.9)
Alkaline Phosphatase: 71 IU/L (ref 44–121)
BUN/Creatinine Ratio: 25 (ref 12–28)
BUN: 18 mg/dL (ref 8–27)
Bilirubin Total: 0.3 mg/dL (ref 0.0–1.2)
CO2: 22 mmol/L (ref 20–29)
Calcium: 10 mg/dL (ref 8.7–10.3)
Chloride: 103 mmol/L (ref 96–106)
Creatinine, Ser: 0.73 mg/dL (ref 0.57–1.00)
Globulin, Total: 2.9 g/dL (ref 1.5–4.5)
Glucose: 89 mg/dL (ref 70–99)
Potassium: 4.5 mmol/L (ref 3.5–5.2)
Sodium: 140 mmol/L (ref 134–144)
Total Protein: 7.7 g/dL (ref 6.0–8.5)
eGFR: 93 mL/min/{1.73_m2} (ref >59)

## 2022-02-02 LAB — LIPID PANEL W/O CHOL/HDL RATIO
Cholesterol, Total: 176 mg/dL (ref 100–199)
HDL: 49 mg/dL (ref >39)
LDL Chol Calc (NIH): 99 mg/dL (ref 0–99)
Triglycerides: 159 mg/dL — ABNORMAL HIGH (ref 0–149)
VLDL Cholesterol Cal: 28 mg/dL (ref 5–40)

## 2022-02-02 NOTE — Progress Notes (Signed)
Contacted via MyChart   Good morning Margaret Fuller, I think we are beginning to head in right direction as cholesterol labs are trending down!!  Great news!! Continue your medications. Kidney function, creatinine and eGFR, remains normal, as is liver function, AST and ALT.  Any questions? Keep being amazing!!  Thank you for allowing me to participate in your care.  I appreciate you. Kindest regards, Madeliene Tejera

## 2022-02-11 ENCOUNTER — Encounter: Payer: Self-pay | Admitting: Nurse Practitioner

## 2022-02-14 ENCOUNTER — Encounter: Payer: Self-pay | Admitting: Nurse Practitioner

## 2022-02-23 ENCOUNTER — Other Ambulatory Visit: Payer: Self-pay | Admitting: Obstetrics and Gynecology

## 2022-02-23 DIAGNOSIS — N951 Menopausal and female climacteric states: Secondary | ICD-10-CM

## 2022-02-25 ENCOUNTER — Ambulatory Visit: Payer: Commercial Managed Care - PPO | Admitting: Nurse Practitioner

## 2022-02-25 ENCOUNTER — Encounter: Payer: Self-pay | Admitting: Nurse Practitioner

## 2022-02-25 VITALS — BP 129/82 | HR 87 | Temp 98.0°F | Wt 214.6 lb

## 2022-02-25 DIAGNOSIS — N3000 Acute cystitis without hematuria: Secondary | ICD-10-CM

## 2022-02-25 MED ORDER — NITROFURANTOIN MONOHYD MACRO 100 MG PO CAPS: 100.0000 mg | ORAL_CAPSULE | Freq: Two times a day (BID) | ORAL | 0 refills | Status: DC

## 2022-02-25 NOTE — Progress Notes (Signed)
BP 129/82   Pulse 87   Temp 98 F (36.7 C) (Oral)   Wt 214 lb 9.6 oz (97.3 kg)   SpO2 97%   BMI 38.02 kg/m    Subjective:    Patient ID: Margaret Fuller, female    DOB: [birth year], 62 y.o.   MRN: [id number]  HPI: Margaret Fuller is a 62 y.o. female  Chief Complaint  Patient presents with   Urinary Tract Infection    Pt states she has been having burning with urination for the last few weeks    URINARY SYMPTOMS Symptoms have been going on for a couple of weeks Dysuria: yes Urinary frequency: yes Urgency: yes Small volume voids: no Symptom severity: no Urinary incontinence: no Foul odor: no Hematuria: no Abdominal pain: no Back pain: yes Suprapubic pain/pressure: no Flank pain: no Fever:  no Vomiting: no Relief with cranberry juice: no Relief with pyridium: no Status: better/worse/stable Previous urinary tract infection: no Recurrent urinary tract infection: no Symptoms are about the same Treatments attempted: increasing fluids   Relevant past medical, surgical, family and social history reviewed and updated as indicated. Interim medical history since our last visit reviewed. Allergies and medications reviewed and updated.  Review of Systems  Constitutional:  Negative for fever.  Gastrointestinal:  Negative for abdominal pain and vomiting.  Genitourinary:  Positive for dysuria, frequency and urgency. Negative for decreased urine volume, flank pain and hematuria.  Musculoskeletal:  Positive for back pain.    Per HPI unless specifically indicated above     Objective:    BP 129/82   Pulse 87   Temp 98 F (36.7 C) (Oral)   Wt 214 lb 9.6 oz (97.3 kg)   SpO2 97%   BMI 38.02 kg/m   Wt Readings from Last 3 Encounters:  02/25/22 214 lb 9.6 oz (97.3 kg)  02/01/22 210 lb 8 oz (95.5 kg)  01/05/22 214 lb (97.1 kg)    Physical Exam Vitals and nursing note reviewed.  Constitutional:      General: She is not in acute distress.     Appearance: Normal appearance. She is normal weight. She is not ill-appearing, toxic-appearing or diaphoretic.  HENT:     Head: Normocephalic.     Right Ear: External ear normal.     Left Ear: External ear normal.     Nose: Nose normal.     Mouth/Throat:     Mouth: Mucous membranes are moist.     Pharynx: Oropharynx is clear.  Eyes:     General:        Right eye: No discharge.        Left eye: No discharge.     Extraocular Movements: Extraocular movements intact.     Conjunctiva/sclera: Conjunctivae normal.     Pupils: Pupils are equal, round, and reactive to light.  Cardiovascular:     Rate and Rhythm: Normal rate and regular rhythm.     Heart sounds: No murmur heard. Pulmonary:     Effort: Pulmonary effort is normal. No respiratory distress.     Breath sounds: Normal breath sounds. No wheezing or rales.  Abdominal:     General: Abdomen is flat. Bowel sounds are normal. There is no distension.     Palpations: Abdomen is soft.     Tenderness: There is no abdominal tenderness. There is no right CVA tenderness, left CVA tenderness or guarding.  Musculoskeletal:     Cervical back: Normal range of motion and neck supple.  Skin:  General: Skin is warm and dry.     Capillary Refill: Capillary refill takes less than 2 seconds.  Neurological:     General: No focal deficit present.     Mental Status: She is alert and oriented to person, place, and time. Mental status is at baseline.  Psychiatric:        Mood and Affect: Mood normal.        Behavior: Behavior normal.        Thought Content: Thought content normal.        Judgment: Judgment normal.     Results for orders placed or performed in visit on 02/01/22  Comprehensive metabolic panel  Result Value Ref Range   Glucose 89 70 - 99 mg/dL   BUN 18 8 - 27 mg/dL   Creatinine, Ser 0.73 0.57 - 1.00 mg/dL   eGFR 93 >59 mL/min/1.73   BUN/Creatinine Ratio 25 12 - 28   Sodium 140 134 - 144 mmol/L   Potassium 4.5 3.5 - 5.2 mmol/L    Chloride 103 96 - 106 mmol/L   CO2 22 20 - 29 mmol/L   Calcium 10.0 8.7 - 10.3 mg/dL   Total Protein 7.7 6.0 - 8.5 g/dL   Albumin 4.8 3.9 - 4.9 g/dL   Globulin, Total 2.9 1.5 - 4.5 g/dL   Albumin/Globulin Ratio 1.7 1.2 - 2.2   Bilirubin Total 0.3 0.0 - 1.2 mg/dL   Alkaline Phosphatase 71 44 - 121 IU/L   AST 26 0 - 40 IU/L   ALT 18 0 - 32 IU/L  Lipid Panel w/o Chol/HDL Ratio  Result Value Ref Range   Cholesterol, Total 176 100 - 199 mg/dL   Triglycerides 159 (H) 0 - 149 mg/dL   HDL 49 >39 mg/dL   VLDL Cholesterol Cal 28 5 - 40 mg/dL   LDL Chol Calc (NIH) 99 0 - 99 mg/dL      Assessment & Plan:   Problem List Items Addressed This Visit   None Visit Diagnoses     Acute cystitis without hematuria    -  Primary   Will treat with macrobid.  Will send out for urinalysis and urine culture.  Will make further recommendations based on lab results.   Relevant Orders   Urinalysis, Routine w reflex microscopic   Urine Culture        Follow up plan: Return if symptoms worsen or fail to improve.

## 2022-03-25 ENCOUNTER — Other Ambulatory Visit: Payer: Self-pay | Admitting: Obstetrics and Gynecology

## 2022-03-25 DIAGNOSIS — N951 Menopausal and female climacteric states: Secondary | ICD-10-CM

## 2022-03-25 MED ORDER — PROGESTERONE MICRONIZED 100 MG PO CAPS: 100.0000 mg | ORAL_CAPSULE | Freq: Every day | ORAL | 0 refills | Status: DC

## 2022-03-25 NOTE — Progress Notes (Signed)
Rx RF eRxd. Pt due for annual

## 2022-03-30 ENCOUNTER — Other Ambulatory Visit: Payer: Self-pay | Admitting: Obstetrics and Gynecology

## 2022-03-30 DIAGNOSIS — N951 Menopausal and female climacteric states: Secondary | ICD-10-CM

## 2022-04-03 ENCOUNTER — Other Ambulatory Visit: Payer: Self-pay | Admitting: Obstetrics and Gynecology

## 2022-04-05 NOTE — Telephone Encounter (Signed)
Pt past due for annual; needs to schedule and can then RF. Thx.

## 2022-04-06 NOTE — Telephone Encounter (Signed)
Can you call pt to get her scheduled for annual? Thx.

## 2022-04-06 NOTE — Telephone Encounter (Signed)
I contacted patient via phone. I left voicemail for patient to call back to be scheduled.   

## 2022-04-07 ENCOUNTER — Other Ambulatory Visit: Payer: Self-pay | Admitting: Obstetrics and Gynecology

## 2022-04-07 MED ORDER — ELESTRIN 0.52 MG/0.87 GM (0.06%) TD GEL: TRANSDERMAL | 0 refills | Status: DC

## 2022-04-07 NOTE — Progress Notes (Signed)
Rx RF elestrin till annual

## 2022-04-12 ENCOUNTER — Encounter: Payer: Self-pay | Admitting: Nurse Practitioner

## 2022-04-13 NOTE — Telephone Encounter (Signed)
LVM to call office back to get her rescheduled for physical after her vacation.

## 2022-04-13 NOTE — Telephone Encounter (Signed)
Attempted to contact patient again, LVM to call to reschedule appointment til after vacation.

## 2022-04-26 ENCOUNTER — Other Ambulatory Visit: Payer: Self-pay | Admitting: Obstetrics and Gynecology

## 2022-04-26 DIAGNOSIS — N951 Menopausal and female climacteric states: Secondary | ICD-10-CM

## 2022-05-06 LAB — NMR, LIPOPROFILE
Cholesterol, Total: 221 mg/dL — ABNORMAL HIGH (ref 100–199)
HDL Particle Number: 33.7 umol/L (ref >=30.5)
HDL-C: 35 mg/dL — ABNORMAL LOW (ref >39)
LDL Particle Number: 1653 nmol/L — ABNORMAL HIGH (ref <1000)
LDL Size: 20 nm — ABNORMAL LOW (ref >20.5)
LDL-C (NIH Calc): 142 mg/dL — ABNORMAL HIGH (ref 0–99)
LP-IR Score: 53 — ABNORMAL HIGH (ref <=45)
Small LDL Particle Number: 1118 nmol/L — ABNORMAL HIGH (ref <=527)
Triglycerides: 242 mg/dL — ABNORMAL HIGH (ref 0–149)

## 2022-05-07 ENCOUNTER — Encounter (HOSPITAL_BASED_OUTPATIENT_CLINIC_OR_DEPARTMENT_OTHER): Payer: Self-pay | Admitting: Internal Medicine

## 2022-05-07 ENCOUNTER — Telehealth (INDEPENDENT_AMBULATORY_CARE_PROVIDER_SITE_OTHER): Payer: Commercial Managed Care - PPO | Admitting: Internal Medicine

## 2022-05-07 VITALS — Ht 62.0 in | Wt 214.0 lb

## 2022-05-07 DIAGNOSIS — E7800 Pure hypercholesterolemia, unspecified: Secondary | ICD-10-CM

## 2022-05-07 DIAGNOSIS — M791 Myalgia, unspecified site: Secondary | ICD-10-CM | POA: Diagnosis not present

## 2022-05-07 DIAGNOSIS — E7841 Elevated Lipoprotein(a): Secondary | ICD-10-CM | POA: Diagnosis not present

## 2022-05-07 NOTE — Progress Notes (Signed)
Virtual Visit via Telephone Note   Because of Kenly Henckel Jodoin's co-morbid illnesses, she is at least at moderate risk for complications without adequate follow up.  This format is felt to be most appropriate for this patient at this time.  The patient did not have access to video technology/had technical difficulties with video requiring transitioning to audio format only (telephone).  All issues noted in this document were discussed and addressed.  No physical exam could be performed with this format.  Please refer to the patient's chart for her consent to telehealth for [facility].   Date:  05/07/2022   ID:  Annye English, DOB 1960-08-24, MRN [id number] The patient was identified using 2 identifiers.  Evaluation Performed:  Follow-Up Visit  Patient Location:  [phone] [place] [facility] [address]  Provider location:   [address] [place], [place] [address]  PCP:  Marjie Skiff, NP  Cardiologist:  None Electrophysiologist:  None   Chief Complaint: Follow-up dyslipidemia  History of Present Illness:    Katti Pelle is a 62 y.o. female who presents via audio/video conferencing for a telehealth visit today.  This is a pleasant 62 year old female kindly referred for evaluation management of dyslipidemia.  She has a strong family history of heart disease mostly on her mother side including her mom who has had 2 prior MIs and has a pacemaker and various other family members with high cholesterol.  In the past her cholesterol was quite high with a total cholesterol 279, triglycerides 154, HDL 54 and LDL 197 (off therapy).  Subsequently she has been started on Repatha since she was intolerant of statins.  This is resulted in some improvement of her cholesterol.  Total now 208, triglycerides 215, HDL 44 and LDL 126.  This is less than the expected 60 to 65% reduction in LDL cholesterol that we typically see with this  medication.  She reports compliance with every 2-week dosing.  Overall is well-tolerated without the side effects that were seen with the statins.  That being said, I would expect her target LDL to be less than 70.  We do not know if she has any cardiovascular disease but she is suspected to have a familial hyperlipidemia.  She reports she has made efforts to lower saturated fats in her diet and is working to lose weight.  05/07/2022  Ms. Batch is seen today for telephone follow-up.  Unfortunately her video and audio would not work.  She wished to discuss the follow-up laboratory work that we performed.  There was concern that her response to PCSK9 inhibitor was suboptimal.  I felt that this was likely due to an elevated LP(a).  I did test her LP(a) which not surprisingly was significantly elevated at 341.7 nmol/L.  I had added ezetimibe but in fact she has had no decrease in her cholesterol rather her LDL has actually gone up.  This is not because of the medication but I suspect related to the fact that she has become much less active since she is dealing with the back pain problem.  Prior CV studies:   The following studies were reviewed today:  Chart reviewed, lab work  PMHx:  Past Medical History:  Diagnosis Date   Allergic rhinitis    Anemia    Hypercholesterolemia    Migraine    Osteopenia    Vocal cord polyp     Past Surgical History:  Procedure Laterality Date   COLONOSCOPY  2015  negative   excision of polyps from vocal cords  1970, 1984, 1985    FAMHx:  Family History  Problem Relation Age of Onset   Diabetes Mother    Colon polyps Mother 33   Heart disease Mother        MI has pacemaker   Hypertension Mother    Heart attack Mother    Diabetes Father    Squamous cell carcinoma Sister        skin on arm   Skin cancer Maternal Aunt    Breast cancer Neg Hx     SOCHx:   reports that she has never smoked. She has never used smokeless tobacco. She reports  current alcohol use. She reports that she does not use drugs.  ALLERGIES:  Allergies  Allergen Reactions   Statins Other (See Comments)    Myopathy     MEDS:  No outpatient medications have been marked as taking for the 05/07/22 encounter (Video Visit) with Chrystie Nose, MD.     ROS: Pertinent items noted in HPI and remainder of comprehensive ROS otherwise negative.  Labs/Other Tests and Data Reviewed:    Recent Labs: 08/03/2021: Hemoglobin 12.2; Platelets 326; TSH 4.010 02/01/2022: ALT 18; BUN 18; Creatinine, Ser 0.73; Potassium 4.5; Sodium 140   Recent Lipid Panel Lab Results  Component Value Date/Time   CHOL 176 02/01/2022 10:41 AM   TRIG 159 (H) 02/01/2022 10:41 AM   HDL 49 02/01/2022 10:41 AM   CHOLHDL 5.3 (H) 08/27/2019 08:37 AM   LDLCALC 99 02/01/2022 10:41 AM    Wt Readings from Last 3 Encounters:  05/07/22 214 lb (97.1 kg)  02/25/22 214 lb 9.6 oz (97.3 kg)  02/01/22 210 lb 8 oz (95.5 kg)     Exam:    Vital Signs:  Ht  (1.575 m)   Wt 214 lb (97.1 kg)   BMI 39.14 kg/m    Deferred due to telephone visit  ASSESSMENT & PLAN:    Probable familial hyperlipidemia, LDL greater than 190 untreated, by Tedra Coupe criteria Multiple family members including her mother with coronary artery disease and high cholesterol Statin intolerance-myalgias Less than expected response to Repatha Elevated LP(a) of 341.7 nmol/L  Ms. Vannatter has a high LP(a) and likely this is the reason why her cholesterol has not come down significantly on therapy.  I suspect it was probably 20 to 30% higher when we had started her on Repatha.  Ezetimibe was added which should theoretically help with some of triglycerides and other particles however her LDL has gone up.  This might be due to less activity.  She will plan to have a repeat lipid testing this summer and will speak to her PCP about ways to become more active and deal with her back pain.  I urged her to consider low-dose  aspirin 81 mg daily for the antiplatelet activity given her high LP(a).  Patient Risk:   After full review of this patients clinical status, I feel that they are at least moderate risk at this time.  Time:   Today, I have spent 15 minutes with the patient with telehealth technology discussing dyslipidemia.     Medication Adjustments/Labs and Tests Ordered: Current medicines are reviewed at length with the patient today.  Concerns regarding medicines are outlined above.   Tests Ordered: No orders of the defined types were placed in this encounter.   Medication Changes: No orders of the defined types were placed in this encounter.   Disposition:  in 6 month(s)  Chrystie Nose, MD, [facility], FACP  [place]  Medical Director of the Advanced Lipid Disorders &  Cardiovascular Risk Reduction Clinic Diplomate of the American Board of Clinical Lipidology Attending Cardiologist  Direct Dial: [phone]  Fax: [phone]  Website:  [email or web address]  Chrystie Nose, MD  05/07/2022 4:23 PM

## 2022-05-07 NOTE — Patient Instructions (Signed)
Medication Instructions:  NO CHANGES  *If you need a refill on your cardiac medications before your next appointment, please call your pharmacy*   Lab Work: FASTING cholesterol test with PCP in the summer, as planned. Please have them send results to our office.    Follow-Up: At [facility], you and your health needs are our priority.  As part of our continuing mission to provide you with exceptional heart care, we have created designated Provider Care Teams.  These Care Teams include your primary Cardiologist (physician) and Advanced Practice Providers (APPs -  Physician Assistants and Nurse Practitioners) who all work together to provide you with the care you need, when you need it.  We recommend signing up for the patient portal called "MyChart".  Sign up information is provided on this After Visit Summary.  MyChart is used to connect with patients for Virtual Visits (Telemedicine).  Patients are able to view lab/test results, encounter notes, upcoming appointments, etc.  Non-urgent messages can be sent to your provider as well.   To learn more about what you can do with MyChart, go to [email or web address].    Your next appointment:    6 months with Dr. Rennis Golden  ** call in mid-May -- early June for an October appointment

## 2022-05-11 ENCOUNTER — Encounter: Payer: Self-pay | Admitting: Nurse Practitioner

## 2022-05-11 NOTE — Telephone Encounter (Signed)
Called and scheduled appointment for 05/12/2022 @ 8:00 am.

## 2022-05-12 ENCOUNTER — Encounter: Payer: Self-pay | Admitting: Family Medicine

## 2022-05-12 ENCOUNTER — Ambulatory Visit
Admission: RE | Admit: 2022-05-12 | Discharge: 2022-05-12 | Disposition: A | Discharge status: Home / Self Care | Payer: Commercial Managed Care - PPO | Attending: Family Medicine | Admitting: Family Medicine

## 2022-05-12 ENCOUNTER — Ambulatory Visit
Admission: RE | Admit: 2022-05-12 | Discharge: 2022-05-12 | Disposition: A | Discharge status: Home / Self Care | Payer: Commercial Managed Care - PPO | Source: Ambulatory Visit | Attending: Family Medicine | Admitting: Family Medicine

## 2022-05-12 ENCOUNTER — Ambulatory Visit: Payer: Commercial Managed Care - PPO | Admitting: Family Medicine

## 2022-05-12 VITALS — BP 116/82 | HR 78 | Temp 98.6°F | Ht 62.0 in | Wt 212.3 lb

## 2022-05-12 DIAGNOSIS — M25561 Pain in right knee: Secondary | ICD-10-CM | POA: Insufficient documentation

## 2022-05-12 MED ORDER — MELOXICAM 7.5 MG PO TABS: 7.5000 mg | ORAL_TABLET | Freq: Every day | ORAL | 0 refills | Status: DC

## 2022-05-12 NOTE — Progress Notes (Signed)
BP 116/82   Pulse 78   Temp 98.6 F (37 C) (Oral)   Ht  (1.575 m)   Wt 212 lb 4.8 oz (96.3 kg)   SpO2 97%   BMI 38.83 kg/m    Subjective:    Patient ID: Margaret Fuller, female    DOB: 02/02/1960, 62 y.o.   MRN: [id number]  HPI: Margaret Fuller is a 62 y.o. female  Chief Complaint  Patient presents with   Knee Pain    Patient says the issue is constant and says it is a lot of stiffness and says it has some "fever in it" and says she was bending over the other day and says she heard a pop in her both knees.    KNEE PAIN Duration: about a week Involved knee: left Mechanism of injury:  popped last week Location: lateral side Onset: gradual Severity: mild  Quality:  stiffness, swollen Frequency: constant Radiation: lateral side of L shin Aggravating factors: crossing her legs  Alleviating factors: nothing  Status: stable Treatments attempted: none  Relief with NSAIDs?:  No NSAIDs Taken Weakness with weight bearing or walking: no Sensation of giving way: no Locking: no Popping: yes Bruising: no Swelling: no Redness: no Paresthesias/decreased sensation: no Fevers: no  Relevant past medical, surgical, family and social history reviewed and updated as indicated. Interim medical history since our last visit reviewed. Allergies and medications reviewed and updated.  Review of Systems  Constitutional: Negative.   Respiratory: Negative.    Cardiovascular: Negative.   Gastrointestinal: Negative.   Musculoskeletal:  Positive for arthralgias and myalgias. Negative for back pain, gait problem, joint swelling, neck pain and neck stiffness.  Skin: Negative.   Neurological: Negative.   Psychiatric/Behavioral: Negative.      Per HPI unless specifically indicated above     Objective:    BP 116/82   Pulse 78   Temp 98.6 F (37 C) (Oral)   Ht  (1.575 m)   Wt 212 lb 4.8 oz (96.3 kg)   SpO2 97%   BMI 38.83 kg/m   Wt Readings  from Last 3 Encounters:  05/12/22 212 lb 4.8 oz (96.3 kg)  05/07/22 214 lb (97.1 kg)  02/25/22 214 lb 9.6 oz (97.3 kg)    Physical Exam Vitals and nursing note reviewed.  Constitutional:      General: She is not in acute distress.    Appearance: Normal appearance. She is not ill-appearing, toxic-appearing or diaphoretic.  HENT:     Head: Normocephalic and atraumatic.     Right Ear: External ear normal.     Left Ear: External ear normal.     Nose: Nose normal.     Mouth/Throat:     Mouth: Mucous membranes are moist.     Pharynx: Oropharynx is clear.  Eyes:     General: No scleral icterus.       Right eye: No discharge.        Left eye: No discharge.     Extraocular Movements: Extraocular movements intact.     Conjunctiva/sclera: Conjunctivae normal.     Pupils: Pupils are equal, round, and reactive to light.  Cardiovascular:     Rate and Rhythm: Normal rate and regular rhythm.     Pulses: Normal pulses.     Heart sounds: Normal heart sounds. No murmur heard.    No friction rub. No gallop.  Pulmonary:     Effort: Pulmonary effort is normal. No respiratory distress.  Breath sounds: Normal breath sounds. No stridor. No wheezing, rhonchi or rales.  Chest:     Chest wall: No tenderness.  Musculoskeletal:        General: No swelling, tenderness, deformity or signs of injury. Normal range of motion.     Cervical back: Normal range of motion and neck supple.     Right lower leg: No edema.     Left lower leg: No edema.     Comments: Negative anterior and posterior draw, negative varus and valgus  Skin:    General: Skin is warm and dry.     Capillary Refill: Capillary refill takes less than 2 seconds.     Coloration: Skin is not jaundiced or pale.     Findings: No bruising, erythema, lesion or rash.  Neurological:     General: No focal deficit present.     Mental Status: She is alert and oriented to person, place, and time. Mental status is at baseline.  Psychiatric:         Mood and Affect: Mood normal.        Behavior: Behavior normal.        Thought Content: Thought content normal.        Judgment: Judgment normal.     Results for orders placed or performed in visit on 02/01/22  Comprehensive metabolic panel  Result Value Ref Range   Glucose 89 70 - 99 mg/dL   BUN 18 8 - 27 mg/dL   Creatinine, Ser 4.09 0.57 - 1.00 mg/dL   eGFR 93 >81 XB/JYN/8.29   BUN/Creatinine Ratio 25 12 - 28   Sodium 140 134 - 144 mmol/L   Potassium 4.5 3.5 - 5.2 mmol/L   Chloride 103 96 - 106 mmol/L   CO2 22 20 - 29 mmol/L   Calcium 10.0 8.7 - 10.3 mg/dL   Total Protein 7.7 6.0 - 8.5 g/dL   Albumin 4.8 3.9 - 4.9 g/dL   Globulin, Total 2.9 1.5 - 4.5 g/dL   Albumin/Globulin Ratio 1.7 1.2 - 2.2   Bilirubin Total 0.3 0.0 - 1.2 mg/dL   Alkaline Phosphatase 71 44 - 121 IU/L   AST 26 0 - 40 IU/L   ALT 18 0 - 32 IU/L  Lipid Panel w/o Chol/HDL Ratio  Result Value Ref Range   Cholesterol, Total 176 100 - 199 mg/dL   Triglycerides 562 (H) 0 - 149 mg/dL   HDL 49 >13 mg/dL   VLDL Cholesterol Cal 28 5 - 40 mg/dL   LDL Chol Calc (NIH) 99 0 - 99 mg/dL      Assessment & Plan:   Problem List Items Addressed This Visit   None Visit Diagnoses     Acute pain of right knee    -  Primary   Will check x-ray and treat with low dose meloxicam. Call if not getting better or getting worse. Continue to monitor.   Relevant Orders   DG Knee Complete 4 Views Left        Follow up plan: Return if symptoms worsen or fail to improve.

## 2022-05-24 ENCOUNTER — Other Ambulatory Visit: Payer: Self-pay | Admitting: Nurse Practitioner

## 2022-05-25 ENCOUNTER — Encounter: Payer: Self-pay | Admitting: Nurse Practitioner

## 2022-05-25 NOTE — Telephone Encounter (Signed)
Requested Prescriptions  Pending Prescriptions Disp Refills   REPATHA SURECLICK 140 MG/ML SOAJ [Pharmacy Med Name: REPATHA SURECLICK 140 MG/ML SUBQ SO] 2 mL 5    Sig: INJECT 140MG  INTO THE SKIN EVERY 14 DAYSAS DIRECTED     Cardiovascular: PCSK9 Inhibitors Failed - 05/24/2022 11:07 AM      Failed - Lipid Panel completed within the last 12 months    Cholesterol, Total  Date Value Ref Range Status  02/01/2022 176 100 - 199 mg/dL Final   LDL Chol Calc (NIH)  Date Value Ref Range Status  02/01/2022 99 0 - 99 mg/dL Final   HDL  Date Value Ref Range Status  02/01/2022 49 >39 mg/dL Final   Triglycerides  Date Value Ref Range Status  02/01/2022 159 (H) 0 - 149 mg/dL Final         Passed - Valid encounter within last 12 months    Recent Outpatient Visits           1 week ago Acute pain of right knee   [place] [facility] [place], Megan P, DO   2 months ago Acute cystitis without hematuria   [place] [facility] Larae Grooms, NP   3 months ago Depression, major, single episode, mild (HCC)   [place] [facility] [place], Bennington T, NP   9 months ago Depression, major, single episode, mild (HCC)   [place] [facility] [place], Corrie Dandy T, NP   1 year ago Depression, major, single episode, mild (HCC)   [place] [facility] [place], Dorie Rank, NP       Future Appointments             In 4 months [place], Dorie Rank, NP [place] [facility]

## 2022-05-27 ENCOUNTER — Ambulatory Visit (INDEPENDENT_AMBULATORY_CARE_PROVIDER_SITE_OTHER): Payer: Commercial Managed Care - PPO | Admitting: Obstetrics and Gynecology

## 2022-05-27 ENCOUNTER — Other Ambulatory Visit (HOSPITAL_COMMUNITY)
Admission: RE | Admit: 2022-05-27 | Discharge: 2022-05-27 | Disposition: A | Discharge status: Home / Self Care | Payer: Commercial Managed Care - PPO | Source: Ambulatory Visit | Attending: Obstetrics and Gynecology | Admitting: Obstetrics and Gynecology

## 2022-05-27 ENCOUNTER — Encounter: Payer: Self-pay | Admitting: Obstetrics and Gynecology

## 2022-05-27 VITALS — BP 110/80 | Ht 65.75 in | Wt 213.0 lb

## 2022-05-27 DIAGNOSIS — Z1151 Encounter for screening for human papillomavirus (HPV): Secondary | ICD-10-CM | POA: Diagnosis present

## 2022-05-27 DIAGNOSIS — R87810 Cervical high risk human papillomavirus (HPV) DNA test positive: Secondary | ICD-10-CM | POA: Diagnosis present

## 2022-05-27 DIAGNOSIS — Z01419 Encounter for gynecological examination (general) (routine) without abnormal findings: Secondary | ICD-10-CM | POA: Diagnosis not present

## 2022-05-27 DIAGNOSIS — Z124 Encounter for screening for malignant neoplasm of cervix: Secondary | ICD-10-CM | POA: Insufficient documentation

## 2022-05-27 DIAGNOSIS — Z1211 Encounter for screening for malignant neoplasm of colon: Secondary | ICD-10-CM

## 2022-05-27 DIAGNOSIS — Z1231 Encounter for screening mammogram for malignant neoplasm of breast: Secondary | ICD-10-CM

## 2022-05-27 DIAGNOSIS — N951 Menopausal and female climacteric states: Secondary | ICD-10-CM

## 2022-05-27 MED ORDER — PROGESTERONE MICRONIZED 100 MG PO CAPS
100.0000 mg | ORAL_CAPSULE | Freq: Every day | ORAL | 3 refills | Status: DC
Start: 2022-05-27 — End: 2023-05-27

## 2022-05-27 MED ORDER — ELESTRIN 0.52 MG/0.87 GM (0.06%) TD GEL: TRANSDERMAL | 2 refills | Status: DC

## 2022-05-27 NOTE — Patient Instructions (Addendum)
I value your feedback and you entrusting us with your care. If you get a [place] patient survey, I would appreciate you taking the time to let us know about your experience today. Thank you!  [facility]: [phone]      

## 2022-05-27 NOTE — Progress Notes (Signed)
PCP: Marjie Skiff, NP   Chief Complaint  Patient presents with   Gynecologic Exam    No concerns    HPI:      Margaret Fuller is a 62 y.o. [phone] whose LMP was No LMP recorded. Patient is postmenopausal., presents today for her annual examination.  Her menses are absent due to menopause, no PMB. Has occas vasomotor sx, on HRT. Likes estrogen in gel form and takes prometrium 100 mg QHS. Is willing to try 1/2 dose ERT to wean down.   Sex activity: single partner, contraception - post menopausal status. She does have vaginal dryness sometimes, improved with lubricants.  Last Pap: 03/11/21 Results were normal, neg HPV DNA. 02/14/20 Results were: no abnormalities /POS HPV DNA. Repeat pap due today. No hx of abn paps with tx in past.  Hx of STDs: HPV on pap  Last mammogram: 06/26/21 Results were: normal, repeat in 12 months  There is no FH of breast cancer. There is no FH of ovarian cancer. The patient does do self-breast exams.  Colonoscopy: 2015 at [facility] but didn't clean out well and had bad experience. Had neg cologuard 2/20 and 4/23; repeat due after 3 yrs.   Tobacco use: The patient denies current or previous tobacco use. Alcohol use: social drinker No drug use Exercise: mod active; having issues with hip and knee pain/arthritis  She does get adequate calcium and Vitamin D in her diet.  Labs with PCP.   Patient Active Problem List   Diagnosis Date Noted   Chronic low back pain with sciatica 08/03/2021   Family history of MI (myocardial infarction) 08/07/2020   Family history of hyperlipidemia 08/07/2020   Myalgia due to statin 01/22/2020   History of anemia 07/26/2019   Depression, major, single episode, mild (HCC) 07/26/2019   Obesity 07/26/2019   Osteopenia of neck of right femur    Intractable migraine with aura without status migrainosus    Hypercholesterolemia    Allergic rhinitis    Vocal cord polyp     Past Surgical History:  Procedure  Laterality Date   COLONOSCOPY  2015   negative   excision of polyps from vocal cords  1970, 1984, 1985    Family History  Problem Relation Age of Onset   Diabetes Mother    Colon polyps Mother 62   Heart disease Mother        MI has pacemaker   Hypertension Mother    Heart attack Mother    Diabetes Father    Squamous cell carcinoma Sister        skin on arm   Skin cancer Maternal Aunt    Breast cancer Neg Hx     Social History   Socioeconomic History   Marital status: Married    Spouse name: Not on file   Number of children: 1   Years of education: Not on file   Highest education level: Not on file  Occupational History   Occupation: Environmental health practitioner, Passenger transport manager  Tobacco Use   Smoking status: Never   Smokeless tobacco: Never  Vaping Use   Vaping Use: Never used  Substance and Sexual Activity   Alcohol use: Yes    Comment: 1/week   Drug use: No   Sexual activity: Yes    Birth control/protection: Post-menopausal  Other Topics Concern   Not on file  Social History Narrative   Daughter graduated from [employer] 2020 thea(took theater and communications).   Social Determinants of  Health   Financial Resource Strain: Not on file  Food Insecurity: Not on file  Transportation Needs: Not on file  Physical Activity: Insufficiently Active (02/03/2018)   Exercise Vital Sign    Days of Exercise per Week: 3 days    Minutes of Exercise per Session: 30 min  Stress: No Stress Concern Present (12/06/2016)   [employer] of Occupational Health - Occupational Stress Questionnaire    Feeling of Stress : Only a little  Social Connections: Moderately Integrated (12/06/2016)   Social Connection and Isolation Panel [NHANES]    Frequency of Communication with Friends and Family: Three times a week    Frequency of Social Gatherings with Friends and Family: Once a week    Attends Religious Services: More than 4 times per year    Active Member of [employer] or  Organizations: No    Attends Banker Meetings: Never    Marital Status: Married  Catering manager Violence: Not At Risk (12/06/2016)   Humiliation, Afraid, Rape, and Kick questionnaire    Fear of Current or Ex-Partner: No    Emotionally Abused: No    Physically Abused: No    Sexually Abused: No     Current Outpatient Medications:    B Complex Vitamins (VITAMIN-B COMPLEX) TABS, Take by mouth., Disp: , Rfl:    Calcium Carb-Cholecalciferol (CALCIUM CARBONATE-VITAMIN D3) 600-400 MG-UNIT TABS, Take by mouth., Disp: , Rfl:    cetirizine (ZYRTEC) 10 MG tablet, Take by mouth., Disp: , Rfl:    diphenhydrAMINE-APAP, sleep, (TYLENOL PM EXTRA STRENGTH PO), Take by mouth at bedtime as needed., Disp: , Rfl:    Esomeprazole Magnesium (NEXIUM PO), Take by mouth daily as needed., Disp: , Rfl:    Evolocumab (REPATHA SURECLICK) 140 MG/ML SOAJ, INJECT 140MG  INTO THE SKIN EVERY 14 DAYSAS DIRECTED, Disp: 2 mL, Rfl: 5   ezetimibe (ZETIA) 10 MG tablet, Take 1 tablet (10 mg total) by mouth daily., Disp: 90 tablet, Rfl: 3   meloxicam (MOBIC) 7.5 MG tablet, Take 1 tablet (7.5 mg total) by mouth daily., Disp: 30 tablet, Rfl: 0   Misc Natural Products (PETADOLEX 75) 75 MG CAPS, Take by mouth., Disp: , Rfl:    nitrofurantoin, macrocrystal-monohydrate, (MACROBID) 100 MG capsule, Take 1 capsule (100 mg total) by mouth 2 (two) times daily., Disp: 10 capsule, Rfl: 0   Omega-3 Fatty Acids (FISH OIL) 1200 MG CAPS, Take by mouth daily., Disp: , Rfl:    Sennosides (SENOKOT PO), Take by mouth daily as needed., Disp: , Rfl:    sertraline (ZOLOFT) 100 MG tablet, Take 1 tablet by mouth daily., Disp: , Rfl:    SUMAtriptan (IMITREX) 100 MG tablet, Take 1 tablet by mouth as needed., Disp: , Rfl:    tretinoin (RETIN-A) 0.025 % cream, Apply 1 Application topically at bedtime., Disp: , Rfl:    UBRELVY 100 MG TABS, Take 1 tablet by mouth daily., Disp: , Rfl:    Estradiol (ELESTRIN) 0.52 MG/0.87 GM (0.06%) GEL, APPLY 1/2 to  1 PUMP TO UPPER ARM AREA EVERY DAY, Disp: 78 g, Rfl: 2   progesterone (PROMETRIUM) 100 MG capsule, Take 1 capsule (100 mg total) by mouth daily., Disp: 90 capsule, Rfl: 3     ROS:  Review of Systems  Constitutional:  Negative for fatigue, fever and unexpected weight change.  Respiratory:  Negative for cough, shortness of breath and wheezing.   Cardiovascular:  Negative for chest pain, palpitations and leg swelling.  Gastrointestinal:  Negative for blood in stool, constipation, diarrhea,  nausea and vomiting.  Endocrine: Negative for cold intolerance, heat intolerance and polyuria.  Genitourinary:  Negative for dyspareunia, dysuria, flank pain, frequency, genital sores, hematuria, menstrual problem, pelvic pain, urgency, vaginal bleeding, vaginal discharge and vaginal pain.  Musculoskeletal:  Positive for arthralgias. Negative for back pain, joint swelling and myalgias.  Skin:  Negative for rash.  Neurological:  Negative for dizziness, syncope, light-headedness, numbness and headaches.  Hematological:  Negative for adenopathy.  Psychiatric/Behavioral:  Negative for agitation, confusion, sleep disturbance and suicidal ideas. The patient is not nervous/anxious.    BREAST: No symptoms    Objective: BP 110/80   Ht 5' 5.75" (1.67 m)   Wt 213 lb (96.6 kg)   BMI 34.64 kg/m    Physical Exam Constitutional:      Appearance: She is well-developed.  Genitourinary:     Vulva normal.     Right Labia: No rash, tenderness or lesions.    Left Labia: No tenderness, lesions or rash.    No vaginal discharge, erythema or tenderness.      Right Adnexa: not tender and no mass present.    Left Adnexa: not tender and no mass present.    No cervical friability or polyp.     Uterus is not enlarged or tender.  Breasts:    Right: No mass, nipple discharge, skin change or tenderness.     Left: No mass, nipple discharge, skin change or tenderness.  Neck:     Thyroid: No thyromegaly.   Cardiovascular:     Rate and Rhythm: Normal rate and regular rhythm.     Heart sounds: Normal heart sounds. No murmur heard. Pulmonary:     Effort: Pulmonary effort is normal.     Breath sounds: Normal breath sounds.  Abdominal:     Palpations: Abdomen is soft.     Tenderness: There is no abdominal tenderness. There is no guarding or rebound.  Musculoskeletal:        General: Normal range of motion.     Cervical back: Normal range of motion.  Lymphadenopathy:     Cervical: No cervical adenopathy.  Neurological:     General: No focal deficit present.     Mental Status: She is alert and oriented to person, place, and time.     Cranial Nerves: No cranial nerve deficit.  Skin:    General: Skin is warm and dry.  Psychiatric:        Mood and Affect: Mood normal.        Behavior: Behavior normal.        Thought Content: Thought content normal.        Judgment: Judgment normal.  Vitals reviewed.     Assessment/Plan:  Encounter for annual routine gynecological examination  Cervical cancer screening - Plan: Cytology - PAP  Screening for HPV (human papillomavirus) - Plan: Cytology - PAP  Cervical high risk human papillomavirus (HPV) DNA test positive - Plan: Cytology - PAP; repeat pap today, will f/u if abn.   Encounter for screening mammogram for malignant neoplasm of breast - Plan: MM 3D SCREENING MAMMOGRAM BILATERAL BREAST; pt to schedule mammo  Vasomotor symptoms due to menopause - Plan: Estradiol (ELESTRIN) 0.52 MG/0.87 GM (0.06%) GEL, progesterone (PROMETRIUM) 100 MG capsule; Rx RF. Doing well. Will try 1/2 dose ERT. F/u prn.    Meds ordered this encounter  Medications   Estradiol (ELESTRIN) 0.52 MG/0.87 GM (0.06%) GEL    Sig: APPLY 1/2 to 1 PUMP TO UPPER ARM AREA EVERY DAY  Dispense:  78 g    Refill:  2    Order Specific Question:   Supervising Provider    Answer:   Hildred Laser [[id number]]   progesterone (PROMETRIUM) 100 MG capsule    Sig: Take 1 capsule (100 mg  total) by mouth daily.    Dispense:  90 capsule    Refill:  3    Order Specific Question:   Supervising Provider    Answer:   Waymon Budge            GYN counsel breast self exam, mammography screening, menopause, adequate intake of calcium and vitamin D, diet and exercise    F/U  Return in about 1 year (around 05/27/2023).  Leanor Voris B. Marrie Chandra, PA-C 05/27/2022 4:29 PM

## 2022-05-31 ENCOUNTER — Encounter: Payer: Self-pay | Admitting: Nurse Practitioner

## 2022-05-31 LAB — CYTOLOGY - PAP: Diagnosis: NEGATIVE

## 2022-07-02 ENCOUNTER — Ambulatory Visit
Admission: RE | Admit: 2022-07-02 | Discharge: 2022-07-02 | Disposition: A | Discharge status: Home / Self Care | Payer: Commercial Managed Care - PPO | Source: Ambulatory Visit | Attending: Obstetrics and Gynecology | Admitting: Obstetrics and Gynecology

## 2022-07-02 DIAGNOSIS — Z1231 Encounter for screening mammogram for malignant neoplasm of breast: Secondary | ICD-10-CM

## 2022-08-16 ENCOUNTER — Encounter: Payer: Commercial Managed Care - PPO | Admitting: Nurse Practitioner

## 2022-09-15 DIAGNOSIS — M5412 Radiculopathy, cervical region: Secondary | ICD-10-CM | POA: Insufficient documentation

## 2022-10-03 NOTE — Patient Instructions (Signed)
Be Involved in Caring For Your Health:  Taking Medications When medications are taken as directed, they can greatly improve your health. But if they are not taken as prescribed, they may not work. In some cases, not taking them correctly can be harmful. To help ensure your treatment remains effective and safe, understand your medications and how to take them. Bring your medications to each visit for review by your provider.  Your lab results, notes, and after visit summary will be available on My Chart. We strongly encourage you to use this feature. If lab results are abnormal the clinic will contact you with the appropriate steps. If the clinic does not contact you assume the results are satisfactory. You can always view your results on My Chart. If you have questions regarding your health or results, please contact the clinic during office hours. You can also ask questions on My Chart.  We at [facility] are grateful that you chose [place] to provide your care. We strive to provide evidence-based and compassionate care and are always looking for feedback. If you get a survey from the clinic please complete this so we can hear your opinions.  Healthy Eating, Adult Healthy eating may help you get and keep a healthy body weight, reduce the risk of chronic disease, and live a long and productive life. It is important to follow a healthy eating pattern. Your nutritional and calorie needs should be met mainly by different nutrient-rich foods. What are tips for following this plan? Reading food labels Read labels and choose the following: Reduced or low sodium products. Juices with 100% fruit juice. Foods with low saturated fats (<3 g per serving) and high polyunsaturated and monounsaturated fats. Foods with whole grains, such as whole wheat, cracked wheat, brown rice, and wild rice. Whole grains that are fortified with folic acid. This is recommended for females who are pregnant or who want to  become pregnant. Read labels and do not eat or drink the following: Foods or drinks with added sugars. These include foods that contain brown sugar, corn sweetener, corn syrup, dextrose, fructose, glucose, high-fructose corn syrup, honey, invert sugar, lactose, malt syrup, maltose, molasses, raw sugar, sucrose, trehalose, or turbinado sugar. Limit your intake of added sugars to less than 10% of your total daily calories. Do not eat more than the following amounts of added sugar per day: 6 teaspoons (25 g) for females. 9 teaspoons (38 g) for males. Foods that contain processed or refined starches and grains. Refined grain products, such as white flour, degermed cornmeal, white bread, and white rice. Shopping Choose nutrient-rich snacks, such as vegetables, whole fruits, and nuts. Avoid high-calorie and high-sugar snacks, such as potato chips, fruit snacks, and candy. Use oil-based dressings and spreads on foods instead of solid fats such as butter, margarine, sour cream, or cream cheese. Limit pre-made sauces, mixes, and "instant" products such as flavored rice, instant noodles, and ready-made pasta. Try more plant-protein sources, such as tofu, tempeh, black beans, edamame, lentils, nuts, and seeds. Explore eating plans such as the Mediterranean diet or vegetarian diet. Try heart-healthy dips made with beans and healthy fats like hummus and guacamole. Vegetables go great with these. Cooking Use oil to saut or stir-fry foods instead of solid fats such as butter, margarine, or lard. Try baking, boiling, grilling, or broiling instead of frying. Remove the fatty part of meats before cooking. Steam vegetables in water or broth. Meal planning  At meals, imagine dividing your plate into fourths: One-half of  your plate is fruits and vegetables. One-fourth of your plate is whole grains. One-fourth of your plate is protein, especially lean meats, poultry, eggs, tofu, beans, or nuts. Include low-fat  dairy as part of your daily diet. Lifestyle Choose healthy options in all settings, including home, work, school, restaurants, or stores. Prepare your food safely: Wash your hands after handling raw meats. Where you prepare food, keep surfaces clean by regularly washing with hot, soapy water. Keep raw meats separate from ready-to-eat foods, such as fruits and vegetables. Cook seafood, meat, poultry, and eggs to the recommended temperature. Get a food thermometer. Store foods at safe temperatures. In general: Keep cold foods at 48F (4.4C) or below. Keep hot foods at 148F (60C) or above. Keep your freezer at [facility] (-17.8C) or below. Foods are not safe to eat if they have been between the temperatures of 40-148F (4.4-60C) for more than 2 hours. What foods should I eat? Fruits Aim to eat 1-2 cups of fresh, canned (in natural juice), or frozen fruits each day. One cup of fruit equals 1 small apple, 1 large banana, 8 large strawberries, 1 cup (237 g) canned fruit,  cup (82 g) dried fruit, or 1 cup (240 mL) 100% juice. Vegetables Aim to eat 2-4 cups of fresh and frozen vegetables each day, including different varieties and colors. One cup of vegetables equals 1 cup (91 g) broccoli or cauliflower florets, 2 medium carrots, 2 cups (150 g) raw, leafy greens, 1 large tomato, 1 large bell pepper, 1 large sweet potato, or 1 medium white potato. Grains Aim to eat 5-10 ounce-equivalents of whole grains each day. Examples of 1 ounce-equivalent of grains include 1 slice of bread, 1 cup (40 g) ready-to-eat cereal, 3 cups (24 g) popcorn, or  cup (93 g) cooked rice. Meats and other proteins Try to eat 5-7 ounce-equivalents of protein each day. Examples of 1 ounce-equivalent of protein include 1 egg,  oz nuts (12 almonds, 24 pistachios, or 7 walnut halves), 1/4 cup (90 g) cooked beans, 6 tablespoons (90 g) hummus or 1 tablespoon (16 g) peanut butter. A cut of meat or fish that is the size of a deck of  cards is about 3-4 ounce-equivalents (85 g). Of the protein you eat each week, try to have at least 8 sounce (227 g) of seafood. This is about 2 servings per week. This includes salmon, trout, herring, sardines, and anchovies. Dairy Aim to eat 3 cup-equivalents of fat-free or low-fat dairy each day. Examples of 1 cup-equivalent of dairy include 1 cup (240 mL) milk, 8 ounces (250 g) yogurt, 1 ounces (44 g) natural cheese, or 1 cup (240 mL) fortified soy milk. Fats and oils Aim for about 5 teaspoons (21 g) of fats and oils per day. Choose monounsaturated fats, such as canola and olive oils, mayonnaise made with olive oil or avocado oil, avocados, peanut butter, and most nuts, or polyunsaturated fats, such as sunflower, corn, and soybean oils, walnuts, pine nuts, sesame seeds, sunflower seeds, and flaxseed. Beverages Aim for 6 eight-ounce glasses of water per day. Limit coffee to 3-5 eight-ounce cups per day. Limit caffeinated beverages that have added calories, such as soda and energy drinks. If you drink alcohol: Limit how much you have to: 0-1 drink a day if you are female. 0-2 drinks a day if you are female. Know how much alcohol is in your drink. In the [place] one drink is one 12 oz bottle of beer (355 mL), one 5 oz glass of wine (  148 mL), or one 1 oz glass of hard liquor (44 mL). Seasoning and other foods Try not to add too much salt to your food. Try using herbs and spices instead of salt. Try not to add sugar to food. This information is based on [place] nutrition guidelines. To learn more, visit [employer]. Exact amounts may vary. You may need different amounts. This information is not intended to replace advice given to you by your health care provider. Make sure you discuss any questions you have with your health care provider. Document Revised: 10/05/2021 Document Reviewed: 10/05/2021 Elsevier Patient Education  2024 [employer].

## 2022-10-08 ENCOUNTER — Encounter: Payer: Self-pay | Admitting: Nurse Practitioner

## 2022-10-08 ENCOUNTER — Ambulatory Visit (INDEPENDENT_AMBULATORY_CARE_PROVIDER_SITE_OTHER): Payer: Commercial Managed Care - PPO | Admitting: Nurse Practitioner

## 2022-10-08 VITALS — BP 128/77 | HR 75 | Temp 98.2°F | Ht 65.3 in | Wt 202.2 lb

## 2022-10-08 DIAGNOSIS — F320 Major depressive disorder, single episode, mild: Secondary | ICD-10-CM

## 2022-10-08 DIAGNOSIS — E7800 Pure hypercholesterolemia, unspecified: Secondary | ICD-10-CM

## 2022-10-08 DIAGNOSIS — T466X5A Adverse effect of antihyperlipidemic and antiarteriosclerotic drugs, initial encounter: Secondary | ICD-10-CM

## 2022-10-08 DIAGNOSIS — G43119 Migraine with aura, intractable, without status migrainosus: Secondary | ICD-10-CM

## 2022-10-08 DIAGNOSIS — E6609 Other obesity due to excess calories: Secondary | ICD-10-CM

## 2022-10-08 DIAGNOSIS — Z8249 Family history of ischemic heart disease and other diseases of the circulatory system: Secondary | ICD-10-CM

## 2022-10-08 DIAGNOSIS — M791 Myalgia, unspecified site: Secondary | ICD-10-CM | POA: Diagnosis not present

## 2022-10-08 DIAGNOSIS — Z0000 Encounter for general adult medical examination without abnormal findings: Secondary | ICD-10-CM | POA: Diagnosis not present

## 2022-10-08 DIAGNOSIS — Z23 Encounter for immunization: Secondary | ICD-10-CM

## 2022-10-08 DIAGNOSIS — Z6834 Body mass index (BMI) 34.0-34.9, adult: Secondary | ICD-10-CM

## 2022-10-08 DIAGNOSIS — M85851 Other specified disorders of bone density and structure, right thigh: Secondary | ICD-10-CM

## 2022-10-08 MED ORDER — REPATHA SURECLICK 140 MG/ML ~~LOC~~ SOAJ: 140.0000 mg | SUBCUTANEOUS | 5 refills | Status: DC

## 2022-10-08 MED ORDER — EZETIMIBE 10 MG PO TABS: 10.0000 mg | ORAL_TABLET | Freq: Every day | ORAL | 4 refills | Status: DC

## 2022-10-08 NOTE — Assessment & Plan Note (Signed)
Chronic, ongoing.  Continue Repatha and Zetia, has seen lipid clinic and they agree with continuing these per notes reviewed.  She did have a 0 on her CT Cardiac scoring.  Poor tolerance to multiple statins in past.  Significant family cardiac history, mother had MI in 7's and without medication patient LDL >190.  Lipid panel and CMP today.  Continue collaboration with lipid specialist, recent note reviewed.

## 2022-10-08 NOTE — Assessment & Plan Note (Signed)
BMI 33.34, has lost 10 pounds since April, praised for this.  Recommended eating smaller high protein, low fat meals more frequently and exercising 30 mins a day 5 times a week with a goal of 10-15lb weight loss in the next 3 months. Patient voiced their understanding and motivation to adhere to these recommendations.

## 2022-10-08 NOTE — Progress Notes (Signed)
BP 128/77   Pulse 75   Temp 98.2 F (36.8 C) (Oral)   Ht 5' 5.3" (1.659 m)   Wt 202 lb 3.2 oz (91.7 kg)   SpO2 96%   BMI 33.34 kg/m    Subjective:    Patient ID: Margaret Fuller, female    DOB: Mar 31, 1960, 62 y.o.   MRN: [id number]  HPI: Jalaysia Furlong is a 62 y.o. female presenting on 10/08/2022 for comprehensive medical examination. Current medical complaints include:none  She currently lives with: husband Menopausal Symptoms: yes  Is taking Elestrin and continues Prometrium for menopausal symptoms -- ordered by GYN and follows with them regularly.  Last visit 05/27/22.  Recent MRI lumbar spine with [facility] noted cholelithiasis.  She does have GERD and takes Nexium daily.  No current abdominal pain.  Is getting steroid injection, had one in left knee months back and they plan for injection in back.  MIGRAINES Last neurology visit was 03/08/22.  Continues on Ubrelvy and Imitrex. Has had about 10 migraines this year. Duration: years Onset: gradual Frequency: intermittent Location: right side of head -- behind eye  Headache duration: Imitrex helps Radiation: no Time of day headache occurs: varies Alleviating factors: Ubrelvy, Imitrex Aggravating factors: back pain, weather shifts Headache status at time of visit: asymptomatic Treatments attempted: triptans   Aura: yes Nausea:  no Vomiting: no Photophobia:  yes Phonophobia:  yes Effect on social functioning:  no Numbers of missed days of school/work each month: 0 Confusion:  no Gait disturbance/ataxia:  no Behavioral changes:  no Fevers:  no   HYPERLIPIDEMIA Continues on Repatha which has been offering benefit.  Unable to take statins or Zetia due to myalgias, even with 3 or 1 day week dosing.  Her CT cardiac scoring showed score of 0 on 01/13/22.  Saw lipid clinic last in April 2024.  Significant family history of heart disease, mother had MI. Hyperlipidemia status: good  compliance Satisfied with current treatment?  yes Side effects:  no Medication compliance: good compliance Past cholesterol meds: multiples statins and Zetia Supplements: none Aspirin:  no The 10-year ASCVD risk score (Arnett DK, et al., 2019) is: 4.6%   Values used to calculate the score:     Age: 64 years     Sex: Female     Is Non-Hispanic African American: No     Diabetic: No     Tobacco smoker: No     Systolic Blood Pressure: 128 mmHg     Is BP treated: No     HDL Cholesterol: 49 mg/dL     Total Cholesterol: 221 mg/dL Chest pain:  no Coronary artery disease:  yes Family history CAD:  yes Family history early CAD:  yes   OSTEOPENIA Last DEXA in 2017. Satisfied with current treatment?: yes Adequate calcium & vitamin D: yes Weight bearing exercises: yes   DEPRESSION Continues on Zoloft daily.   Mood status: stable Satisfied with current treatment?: yes Symptom severity: moderate  Duration of current treatment : chronic Side effects: no Medication compliance: good compliance Psychotherapy/counseling: none Depressed mood: no Anxious mood: no Anhedonia: no Significant weight loss or gain: no Insomnia: none Fatigue: no Feelings of worthlessness or guilt: no Impaired concentration/indecisiveness: no Suicidal ideations: no Hopelessness: no Crying spells: yes    10/08/2022    8:48 AM 02/01/2022    9:56 AM 08/03/2021   10:20 AM 02/03/2021    4:34 PM 08/01/2020   10:11 AM  Depression screen PHQ 2/9  Decreased Interest  0 0 0 0 0  Down, Depressed, Hopeless 0 0 1 0 0  PHQ - 2 Score 0 0 1 0 0  Altered sleeping 0 0 1 0 0  Tired, decreased energy 0 0 0 0 0  Change in appetite 0 0 0 0 0  Feeling bad or failure about yourself  0 0 0 0 0  Trouble concentrating 0 0 1 0 0  Moving slowly or fidgety/restless 0 0 0 0 0  Suicidal thoughts 0 0 0 0 0  PHQ-9 Score 0 0 3 0 0  Difficult doing work/chores Not difficult at all Not difficult at all Not difficult at all Not difficult  at all Not difficult at all       10/08/2022    8:48 AM 02/01/2022    9:56 AM 08/03/2021   10:20 AM 02/03/2021    4:33 PM  GAD 7 : Generalized Anxiety Score  Nervous, Anxious, on Edge 0 0 1 1  Control/stop worrying 0 0 1 0  Worry too much - different things 0 0 0 0  Trouble relaxing 0 0 1 0  Restless 0 0 0 0  Easily annoyed or irritable 0 0 0 0  Afraid - awful might happen 0 0 0 0  Total GAD 7 Score 0 0 3 1  Anxiety Difficulty Not difficult at all Not difficult at all Not difficult at all Not difficult at all        01/22/2020    1:07 PM 08/03/2021   10:15 AM 08/03/2021   10:39 AM 02/01/2022    9:56 AM 10/08/2022    8:47 AM  Fall Risk  Falls in the past year? 0 0 0 0 0  Was there an injury with Fall?  0 0 0 0  Fall Risk Category Calculator  0 0 0 0  Fall Risk Category (Retired)  Low Low    (RETIRED) Patient Fall Risk Level  Low fall risk Low fall risk    Patient at Risk for Falls Due to  No Fall Risks No Fall Risks No Fall Risks No Fall Risks  Fall risk Follow up  Falls evaluation completed Falls evaluation completed Falls evaluation completed Falls evaluation completed     Functional Status Survey: Is the patient deaf or have difficulty hearing?: No Does the patient have difficulty seeing, even when wearing glasses/contacts?: No Does the patient have difficulty concentrating, remembering, or making decisions?: No Does the patient have difficulty walking or climbing stairs?: No Does the patient have difficulty dressing or bathing?: No Does the patient have difficulty doing errands alone such as visiting a doctor's office or shopping?: No   Past Medical History:  Past Medical History:  Diagnosis Date   Allergic rhinitis    Anemia    Hypercholesterolemia    Migraine    Osteopenia    Vocal cord polyp     Surgical History:  Past Surgical History:  Procedure Laterality Date   COLONOSCOPY  2015   negative   excision of polyps from vocal cords  1970, 1984, 1985     Medications:  Current Outpatient Medications on File Prior to Visit  Medication Sig   B Complex Vitamins (VITAMIN-B COMPLEX) TABS Take by mouth.   Calcium Carb-Cholecalciferol (CALCIUM CARBONATE-VITAMIN D3) 600-400 MG-UNIT TABS Take by mouth.   cetirizine (ZYRTEC) 10 MG tablet Take by mouth.   diphenhydrAMINE-APAP, sleep, (TYLENOL PM EXTRA STRENGTH PO) Take by mouth at bedtime as needed.   Esomeprazole Magnesium (NEXIUM PO) Take  by mouth daily as needed.   Estradiol (ELESTRIN) 0.52 MG/0.87 GM (0.06%) GEL APPLY 1/2 to 1 PUMP TO UPPER ARM AREA EVERY DAY   Misc Natural Products (PETADOLEX 75) 75 MG CAPS Take by mouth.   Omega-3 Fatty Acids (FISH OIL) 1200 MG CAPS Take by mouth daily.   progesterone (PROMETRIUM) 100 MG capsule Take 1 capsule (100 mg total) by mouth daily.   Sennosides (SENOKOT PO) Take by mouth daily as needed.   sertraline (ZOLOFT) 100 MG tablet Take 1 tablet by mouth daily.   SUMAtriptan (IMITREX) 100 MG tablet Take 1 tablet by mouth as needed.   tretinoin (RETIN-A) 0.025 % cream Apply 1 Application topically at bedtime.   UBRELVY 100 MG TABS Take 1 tablet by mouth daily.   No current facility-administered medications on file prior to visit.    Allergies:  Allergies  Allergen Reactions   Statins Other (See Comments)    Myopathy     Social History:  Social History   Socioeconomic History   Marital status: Married    Spouse name: Not on file   Number of children: 1   Years of education: Not on file   Highest education level: Not on file  Occupational History   Occupation: Environmental health practitioner, Passenger transport manager  Tobacco Use   Smoking status: Never   Smokeless tobacco: Never  Vaping Use   Vaping status: Never Used  Substance and Sexual Activity   Alcohol use: Yes    Comment: 1/week   Drug use: No   Sexual activity: Yes    Birth control/protection: Post-menopausal  Other Topics Concern   Not on file  Social History Narrative   Daughter graduated from  [employer] 2020 thea(took theater and communications).   Social Determinants of Health   Financial Resource Strain: Not on file  Food Insecurity: Not on file  Transportation Needs: Not on file  Physical Activity: Insufficiently Active (02/03/2018)   Exercise Vital Sign    Days of Exercise per Week: 3 days    Minutes of Exercise per Session: 30 min  Stress: No Stress Concern Present (12/06/2016)   [employer] of Occupational Health - Occupational Stress Questionnaire    Feeling of Stress : Only a little  Social Connections: Moderately Integrated (12/06/2016)   Social Connection and Isolation Panel [NHANES]    Frequency of Communication with Friends and Family: Three times a week    Frequency of Social Gatherings with Friends and Family: Once a week    Attends Religious Services: More than 4 times per year    Active Member of [employer] or Organizations: No    Attends Banker Meetings: Never    Marital Status: Married  Catering manager Violence: Not At Risk (12/06/2016)   Humiliation, Afraid, Rape, and Kick questionnaire    Fear of Current or Ex-Partner: No    Emotionally Abused: No    Physically Abused: No    Sexually Abused: No   Social History   Tobacco Use  Smoking Status Never  Smokeless Tobacco Never   Social History   Substance and Sexual Activity  Alcohol Use Yes   Comment: 1/week    Family History:  Family History  Problem Relation Age of Onset   Diabetes Mother    Colon polyps Mother 47   Heart disease Mother        MI has pacemaker   Hypertension Mother    Heart attack Mother    Diabetes Father    Squamous cell carcinoma Sister  skin on arm   Skin cancer Maternal Aunt    Breast cancer Neg Hx     Past medical history, surgical history, medications, allergies, family history and social history reviewed with patient today and changes made to appropriate areas of the chart.   ROS All other ROS negative except what is listed above  and in the HPI.      Objective:    BP 128/77   Pulse 75   Temp 98.2 F (36.8 C) (Oral)   Ht 5' 5.3" (1.659 m)   Wt 202 lb 3.2 oz (91.7 kg)   SpO2 96%   BMI 33.34 kg/m   Wt Readings from Last 3 Encounters:  10/08/22 202 lb 3.2 oz (91.7 kg)  05/27/22 213 lb (96.6 kg)  05/12/22 212 lb 4.8 oz (96.3 kg)    Physical Exam Vitals and nursing note reviewed. Exam conducted with a chaperone present.  Constitutional:      General: She is awake. She is not in acute distress.    Appearance: She is well-developed and well-groomed. She is obese. She is not ill-appearing or toxic-appearing.  HENT:     Head: Normocephalic and atraumatic.     Right Ear: Hearing, tympanic membrane, ear canal and external ear normal. No drainage.     Left Ear: Hearing, tympanic membrane, ear canal and external ear normal. No drainage.     Nose: Nose normal.     Right Sinus: No maxillary sinus tenderness or frontal sinus tenderness.     Left Sinus: No maxillary sinus tenderness or frontal sinus tenderness.     Mouth/Throat:     Mouth: Mucous membranes are moist.     Pharynx: Oropharynx is clear. Uvula midline. No pharyngeal swelling, oropharyngeal exudate or posterior oropharyngeal erythema.  Eyes:     General: Lids are normal.        Right eye: No discharge.        Left eye: No discharge.     Extraocular Movements: Extraocular movements intact.     Conjunctiva/sclera: Conjunctivae normal.     Pupils: Pupils are equal, round, and reactive to light.     Visual Fields: Right eye visual fields normal and left eye visual fields normal.  Neck:     Thyroid: No thyromegaly.     Vascular: No carotid bruit.     Trachea: Trachea normal.  Cardiovascular:     Rate and Rhythm: Normal rate and regular rhythm.     Heart sounds: Normal heart sounds. No murmur heard.    No gallop.  Pulmonary:     Effort: Pulmonary effort is normal. No accessory muscle usage or respiratory distress.     Breath sounds: Normal breath  sounds.  Abdominal:     General: Bowel sounds are normal.     Palpations: Abdomen is soft. There is no hepatomegaly or splenomegaly.     Tenderness: There is no abdominal tenderness.  Musculoskeletal:        General: Normal range of motion.     Cervical back: Normal range of motion and neck supple.     Right lower leg: No edema.     Left lower leg: No edema.  Lymphadenopathy:     Head:     Right side of head: No submental, submandibular, tonsillar, preauricular or posterior auricular adenopathy.     Left side of head: No submental, submandibular, tonsillar, preauricular or posterior auricular adenopathy.     Cervical: No cervical adenopathy.  Skin:    General: Skin  is warm and dry.     Capillary Refill: Capillary refill takes less than 2 seconds.     Findings: No rash.  Neurological:     Mental Status: She is alert and oriented to person, place, and time.     Gait: Gait is intact.     Deep Tendon Reflexes: Reflexes are normal and symmetric.     Reflex Scores:      Brachioradialis reflexes are 2+ on the right side and 2+ on the left side.      Patellar reflexes are 2+ on the right side and 2+ on the left side. Psychiatric:        Attention and Perception: Attention normal.        Mood and Affect: Mood normal.        Speech: Speech normal.        Behavior: Behavior normal. Behavior is cooperative.        Thought Content: Thought content normal.        Judgment: Judgment normal.    Results for orders placed or performed in visit on 05/27/22  Cytology - PAP  Result Value Ref Range   Adequacy      Satisfactory for evaluation. The presence or absence of an   Adequacy      endocervical/transformation zone component cannot be determined because   Adequacy of atrophy.    Diagnosis      - Negative for intraepithelial lesion or malignancy (NILM)      Assessment & Plan:   Problem List Items Addressed This Visit       Cardiovascular and Mediastinum   Intractable migraine with  aura without status migrainosus    Chronic, stable with minimal migraines.  Followed by neurology, continue this collaboration and current medication regimen as prescribed by them.  Return in 6 months.      Relevant Medications   ezetimibe (ZETIA) 10 MG tablet   Evolocumab (REPATHA SURECLICK) 140 MG/ML SOAJ   Other Relevant Orders   CBC with Differential/Platelet     Musculoskeletal and Integument   Osteopenia of neck of right femur    Noted on DEXA in 2017 with T -1.6.  Continue daily supplements, Calcium and Vitamin D.  Plan to repeat DEXA in 2025, ordered and discussed with her.  Check Vit D level today.      Relevant Orders   VITAMIN D 25 Hydroxy (Vit-D Deficiency, Fractures)   DG Bone Density     Other   Depression, major, single episode, mild (HCC) - Primary    Chronic, ongoing.  Continue this regimen and adjust as needed.  Stable on regimen at this time.  Denies SI/HI.  Return in 6 months.      Relevant Orders   TSH   Family history of MI (myocardial infarction)    Will continue Repatha and Zetia, as this is offering benefit and can not take statins.      Hypercholesterolemia    Chronic, ongoing.  Continue Repatha and Zetia, has seen lipid clinic and they agree with continuing these per notes reviewed.  She did have a 0 on her CT Cardiac scoring.  Poor tolerance to multiple statins in past.  Significant family cardiac history, mother had MI in 59's and without medication patient LDL >190.  Lipid panel and CMP today.  Continue collaboration with lipid specialist, recent note reviewed.      Relevant Medications   ezetimibe (ZETIA) 10 MG tablet   Evolocumab (REPATHA SURECLICK) 140 MG/ML SOAJ  Other Relevant Orders   Comprehensive metabolic panel   Lipid Panel w/o Chol/HDL Ratio   Myalgia due to statin    Has history of poor tolerance to multiple statins & Zetia, but currently tolerating Repatha with Zetia well.  Lipid panel today.        Obesity    BMI 33.34, has  lost 10 pounds since April, praised for this.  Recommended eating smaller high protein, low fat meals more frequently and exercising 30 mins a day 5 times a week with a goal of 10-15lb weight loss in the next 3 months. Patient voiced their understanding and motivation to adhere to these recommendations.       Other Visit Diagnoses     Flu vaccine need       Flu vaccine due and provided in office today, educated patient and consent obtained.   Relevant Orders   Flu vaccine trivalent PF, 6mos and older(Flulaval,Afluria,Fluarix,Fluzone) (Completed)   Encounter for annual physical exam       Annual physical today with health maintenance reviewed with patient.        Follow up plan: Return in about 6 months (around 04/07/2023) for HLD, MIGRAINES, MOOD, GERD.   LABORATORY TESTING:  - Pap smear: up to date  IMMUNIZATIONS:   - Tdap: Tetanus vaccination status reviewed: last tetanus booster within 10 years. - Influenza: Up to date - Pneumovax: Not applicable - Prevnar: Not applicable - COVID: Up to date - HPV: Not applicable - Shingrix vaccine: Up to date  SCREENING: -Mammogram: Up to date due next 07/02/23 - Colonoscopy: Up to date  - Bone Density:Ordered today -Hearing Test: Not applicable  -Spirometry: Not applicable   PATIENT COUNSELING:   Advised to take 1 mg of folate supplement per day if capable of pregnancy.   Sexuality: Discussed sexually transmitted diseases, partner selection, use of condoms, avoidance of unintended pregnancy  and contraceptive alternatives.   Advised to avoid cigarette smoking.  I discussed with the patient that most people either abstain from alcohol or drink within safe limits (<=14/week and <=4 drinks/occasion for males, <=7/weeks and <= 3 drinks/occasion for females) and that the risk for alcohol disorders and other health effects rises proportionally with the number of drinks per week and how often a drinker exceeds daily limits.  Discussed  cessation/primary prevention of drug use and availability of treatment for abuse.   Diet: Encouraged to adjust caloric intake to maintain  or achieve ideal body weight, to reduce intake of dietary saturated fat and total fat, to limit sodium intake by avoiding high sodium foods and not adding table salt, and to maintain adequate dietary potassium and calcium preferably from fresh fruits, vegetables, and low-fat dairy products.    Stressed the importance of regular exercise  Injury prevention: Discussed safety belts, safety helmets, smoke detector, smoking near bedding or upholstery.   Dental health: Discussed importance of regular tooth brushing, flossing, and dental visits.    NEXT PREVENTATIVE PHYSICAL DUE IN 1 YEAR. Return in about 6 months (around 04/07/2023) for HLD, MIGRAINES, MOOD, GERD.

## 2022-10-08 NOTE — Assessment & Plan Note (Signed)
Chronic, stable with minimal migraines.  Followed by neurology, continue this collaboration and current medication regimen as prescribed by them.  Return in 6 months.

## 2022-10-08 NOTE — Assessment & Plan Note (Signed)
Chronic, ongoing.  Continue this regimen and adjust as needed.  Stable on regimen at this time.  Denies SI/HI.  Return in 6 months.

## 2022-10-08 NOTE — Assessment & Plan Note (Signed)
Will continue Repatha and Zetia, as this is offering benefit and can not take statins.

## 2022-10-08 NOTE — Assessment & Plan Note (Signed)
Noted on DEXA in 2017 with T -1.6.  Continue daily supplements, Calcium and Vitamin D.  Plan to repeat DEXA in 2025, ordered and discussed with her.  Check Vit D level today.

## 2022-10-08 NOTE — Assessment & Plan Note (Signed)
Has history of poor tolerance to multiple statins & Zetia, but currently tolerating Repatha with Zetia well.  Lipid panel today.

## 2022-10-09 LAB — COMPREHENSIVE METABOLIC PANEL
ALT: 12 IU/L (ref 0–32)
AST: 21 IU/L (ref 0–40)
Albumin: 4.3 g/dL (ref 3.9–4.9)
Alkaline Phosphatase: 72 IU/L (ref 44–121)
BUN/Creatinine Ratio: 31 — ABNORMAL HIGH (ref 12–28)
BUN: 21 mg/dL (ref 8–27)
Bilirubin Total: 0.3 mg/dL (ref 0.0–1.2)
CO2: 24 mmol/L (ref 20–29)
Calcium: 9.5 mg/dL (ref 8.7–10.3)
Chloride: 103 mmol/L (ref 96–106)
Creatinine, Ser: 0.68 mg/dL (ref 0.57–1.00)
Globulin, Total: 2.8 g/dL (ref 1.5–4.5)
Glucose: 84 mg/dL (ref 70–99)
Potassium: 4.7 mmol/L (ref 3.5–5.2)
Sodium: 141 mmol/L (ref 134–144)
Total Protein: 7.1 g/dL (ref 6.0–8.5)
eGFR: 98 mL/min/{1.73_m2} (ref >59)

## 2022-10-09 LAB — CBC WITH DIFFERENTIAL/PLATELET
Basophils Absolute: 0.1 10*3/uL (ref 0.0–0.2)
Basos: 1 %
EOS (ABSOLUTE): 0.3 10*3/uL (ref 0.0–0.4)
Eos: 4 %
Hematocrit: 38.1 % (ref 34.0–46.6)
Hemoglobin: 12.4 g/dL (ref 11.1–15.9)
Immature Grans (Abs): 0 10*3/uL (ref 0.0–0.1)
Immature Granulocytes: 0 %
Lymphocytes Absolute: 2 10*3/uL (ref 0.7–3.1)
Lymphs: 31 %
MCH: 32.4 pg (ref 26.6–33.0)
MCHC: 32.5 g/dL (ref 31.5–35.7)
MCV: 100 fL — ABNORMAL HIGH (ref 79–97)
Monocytes Absolute: 0.4 10*3/uL (ref 0.1–0.9)
Monocytes: 7 %
Neutrophils Absolute: 3.7 10*3/uL (ref 1.4–7.0)
Neutrophils: 57 %
Platelets: 343 10*3/uL (ref 150–450)
RBC: 3.83 x10E6/uL (ref 3.77–5.28)
RDW: 12.7 % (ref 11.7–15.4)
WBC: 6.4 10*3/uL (ref 3.4–10.8)

## 2022-10-09 LAB — LIPID PANEL W/O CHOL/HDL RATIO
Cholesterol, Total: 175 mg/dL (ref 100–199)
HDL: 45 mg/dL (ref >39)
LDL Chol Calc (NIH): 107 mg/dL — ABNORMAL HIGH (ref 0–99)
Triglycerides: 131 mg/dL (ref 0–149)
VLDL Cholesterol Cal: 23 mg/dL (ref 5–40)

## 2022-10-09 LAB — VITAMIN D 25 HYDROXY (VIT D DEFICIENCY, FRACTURES): Vit D, 25-Hydroxy: 54.1 ng/mL (ref 30.0–100.0)

## 2022-10-09 LAB — TSH: TSH: 2.68 u[IU]/mL (ref 0.450–4.500)

## 2022-10-09 NOTE — Progress Notes (Signed)
Contacted via MyChart   Good morning Margaret Fuller,  your labs have returned: - CBC shows no anemia or infection. - Kidney function, creatinine and eGFR, remains normal, as is liver function, AST and ALT.  - Cholesterol levels are stable, but LDL still a little elevated.  Continue Repatha and Zetia for now and bring Repatha into next visit so we can assess injection technique.  Also maybe try a different injection site, like tissue under arms or thighs.   - Remainder of labs are stable.  Any questions? Keep being wonderful!!  Thank you for allowing me to participate in your care.  I appreciate you. Kindest regards, Darean Rote

## 2022-12-10 ENCOUNTER — Encounter: Payer: Self-pay | Admitting: Obstetrics and Gynecology

## 2022-12-10 ENCOUNTER — Other Ambulatory Visit: Payer: Self-pay | Admitting: Obstetrics and Gynecology

## 2022-12-10 DIAGNOSIS — N951 Menopausal and female climacteric states: Secondary | ICD-10-CM

## 2022-12-28 ENCOUNTER — Ambulatory Visit
Admission: RE | Admit: 2022-12-28 | Discharge: 2022-12-28 | Disposition: A | Discharge status: Home / Self Care | Payer: Commercial Managed Care - PPO | Source: Ambulatory Visit | Attending: Nurse Practitioner | Admitting: Nurse Practitioner

## 2022-12-28 DIAGNOSIS — M85851 Other specified disorders of bone density and structure, right thigh: Secondary | ICD-10-CM | POA: Diagnosis present

## 2022-12-28 NOTE — Progress Notes (Signed)
Contacted via [facility]   Your bone density shows thinning bones (osteopenia) but not brittle (osteoporosis). We recommend Vitamin D supplementation of about 2,0000 IUs of over the counter Vitamin D3. In addition, we recommend a diet high in calcium with dairy and dark green leafy vegetables. We would like you to get plenty of weight bearing exercises with walking and resistance training such as light weights or resistance bands available with instructions at places such as [employer].

## 2023-04-06 ENCOUNTER — Encounter: Payer: Self-pay | Admitting: Nurse Practitioner

## 2023-04-06 ENCOUNTER — Ambulatory Visit: Admitting: Nurse Practitioner

## 2023-04-06 ENCOUNTER — Other Ambulatory Visit

## 2023-04-06 ENCOUNTER — Other Ambulatory Visit: Payer: Self-pay | Admitting: Nurse Practitioner

## 2023-04-06 VITALS — BP 135/78 | HR 97 | Temp 98.0°F | Ht 65.5 in | Wt 202.6 lb

## 2023-04-06 DIAGNOSIS — F320 Major depressive disorder, single episode, mild: Secondary | ICD-10-CM

## 2023-04-06 DIAGNOSIS — E7800 Pure hypercholesterolemia, unspecified: Secondary | ICD-10-CM | POA: Diagnosis not present

## 2023-04-06 DIAGNOSIS — M791 Myalgia, unspecified site: Secondary | ICD-10-CM | POA: Diagnosis not present

## 2023-04-06 DIAGNOSIS — T466X5A Adverse effect of antihyperlipidemic and antiarteriosclerotic drugs, initial encounter: Secondary | ICD-10-CM

## 2023-04-06 DIAGNOSIS — E66811 Obesity, class 1: Secondary | ICD-10-CM

## 2023-04-06 DIAGNOSIS — Z83438 Family history of other disorder of lipoprotein metabolism and other lipidemia: Secondary | ICD-10-CM

## 2023-04-06 DIAGNOSIS — G43119 Migraine with aura, intractable, without status migrainosus: Secondary | ICD-10-CM | POA: Diagnosis not present

## 2023-04-06 DIAGNOSIS — Z6833 Body mass index (BMI) 33.0-33.9, adult: Secondary | ICD-10-CM

## 2023-04-06 DIAGNOSIS — E6609 Other obesity due to excess calories: Secondary | ICD-10-CM

## 2023-04-06 NOTE — Patient Instructions (Signed)
Wegovy or Zepbound  Focus on DASH diet for high blood pressure or Mediterranean diet  Be Involved in Caring For Your Health:  Taking Medications When medications are taken as directed, they can greatly improve your health. But if they are not taken as prescribed, they may not work. In some cases, not taking them correctly can be harmful. To help ensure your treatment remains effective and safe, understand your medications and how to take them. Bring your medications to each visit for review by your provider.  Your lab results, notes, and after visit summary will be available on My Chart. We strongly encourage you to use this feature. If lab results are abnormal the clinic will contact you with the appropriate steps. If the clinic does not contact you assume the results are satisfactory. You can always view your results on My Chart. If you have questions regarding your health or results, please contact the clinic during office hours. You can also ask questions on My Chart.  We at [facility] are grateful that you chose us to provide your care. We strive to provide evidence-based and compassionate care and are always looking for feedback. If you get a survey from the clinic please complete this so we can hear your opinions.  Preventing High Cholesterol Cholesterol is a white, waxy substance similar to fat that the human body needs to help build cells. The liver makes all the cholesterol that a person's body needs. Having high cholesterol (hypercholesterolemia) increases your risk for heart disease and stroke. Extra or excess cholesterol comes from the food that you eat. High cholesterol can often be prevented with diet and lifestyle changes. If you already have high cholesterol, you can control it with diet, lifestyle changes, and medicines. How can high cholesterol affect me? If you have high cholesterol, fatty deposits (plaques) may build up on the walls of your blood vessels. The blood  vessels that carry blood away from your heart are called arteries. Plaques make the arteries narrower and stiffer. This in turn can: Restrict or block blood flow and cause blood clots to form. Increase your risk for heart attack and stroke. What can increase my risk for high cholesterol? This condition is more likely to develop in people who: Eat foods that are high in saturated fat or cholesterol. Saturated fat is mostly found in foods that come from animal sources. Are overweight. Are not getting enough exercise. Use products that contain nicotine or tobacco, such as cigarettes, e-cigarettes, and chewing tobacco. Have a family history of high cholesterol (familial hypercholesterolemia). What actions can I take to prevent this? Nutrition  Eat less saturated fat. Avoid trans fats (partially hydrogenated oils). These are often found in margarine and in some baked goods, fried foods, and snacks bought in packages. Avoid precooked or cured meat, such as bacon, sausages, or meat loaves. Avoid foods and drinks that have added sugars. Eat more fruits, vegetables, and whole grains. Choose healthy sources of protein, such as fish, poultry, lean cuts of red meat, beans, peas, lentils, and nuts. Choose healthy sources of fat, such as: Nuts. Vegetable oils, especially olive oil. Fish that have healthy fats, such as omega-3 fatty acids. These fish include mackerel or salmon. Lifestyle Lose weight if you are overweight. Maintaining a healthy body mass index (BMI) can help prevent or control high cholesterol. It can also lower your risk for diabetes and high blood pressure. Ask your health care provider to help you with a diet and exercise plan to lose   weight safely. Do not use any products that contain nicotine or tobacco. These products include cigarettes, chewing tobacco, and vaping devices, such as e-cigarettes. If you need help quitting, ask your health care provider. Alcohol use Do not drink  alcohol if: Your health care provider tells you not to drink. You are pregnant, may be pregnant, or are planning to become pregnant. If you drink alcohol: Limit how much you have to: 0-1 drink a day for women. 0-2 drinks a day for men. Know how much alcohol is in your drink. In the [place] one drink equals one 12 oz bottle of beer (355 mL), one 5 oz glass of wine (148 mL), or one 1 oz glass of hard liquor (44 mL). Activity  Get enough exercise. Do exercises as told by your health care provider. Each week, do at least 150 minutes of exercise that takes a medium level of effort (moderate-intensity exercise). This kind of exercise: Makes your heart beat faster while allowing you to still be able to talk. Can be done in short sessions several times a day or longer sessions a few times a week. For example, on 5 days each week, you could walk fast or ride your bike 3 times a day for 10 minutes each time. Medicines Your health care provider may recommend medicines to help lower cholesterol. This may be a medicine to lower the amount of cholesterol that your liver makes. You may need medicine if: Diet and lifestyle changes have not lowered your cholesterol enough. You have high cholesterol and other risk factors for heart disease or stroke. Take over-the-counter and prescription medicines only as told by your health care provider. General information Manage your risk factors for high cholesterol. Talk with your health care provider about all your risk factors and how to lower your risk. Manage other conditions that you have, such as diabetes or high blood pressure (hypertension). Have blood tests to check your cholesterol levels at regular points in time as told by your health care provider. Keep all follow-up visits. This is important. Where to find more information American Heart Association: www.heart.org National Heart, Lung, and Blood Institute: www.nhlbi.nih.gov Summary High cholesterol  increases your risk for heart disease and stroke. By keeping your cholesterol level low, you can reduce your risk for these conditions. High cholesterol can often be prevented with diet and lifestyle changes. Work with your health care provider to manage your risk factors, and have your blood tested regularly. This information is not intended to replace advice given to you by your health care provider. Make sure you discuss any questions you have with your health care provider. Document Revised: 08/07/2021 Document Reviewed: 03/10/2020 Elsevier Patient Education  2024 [employer]vier Inc.  

## 2023-04-06 NOTE — Assessment & Plan Note (Signed)
Will continue Repatha as is offering benefit to her.

## 2023-04-06 NOTE — Assessment & Plan Note (Signed)
Chronic, ongoing.  Continue this regimen and adjust as needed.  Stable on regimen at this time.  Denies SI/HI.  Return in 6 months.
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
## 2023-04-06 NOTE — Assessment & Plan Note (Signed)
 Chronic, ongoing.  Continue Repatha and Zetia, has seen lipid clinic and they agree with continuing these per notes reviewed.  Did have a 0 on her CT Cardiac scoring.  Poor tolerance to multiple statins in past.  Significant family cardiac history, mother had MI in 78's and without medication patient LDL >190.  Lipid panel and CMP today.  Continue collaboration with lipid specialist as needed, recent note reviewed.

## 2023-04-06 NOTE — Assessment & Plan Note (Signed)
Chronic, stable with minimal migraines.  Followed by neurology, continue this collaboration and current medication regimen as prescribed by them.  Return in 6 months.

## 2023-04-06 NOTE — Assessment & Plan Note (Signed)
Has history of poor tolerance to multiple statins & Zetia, but currently tolerating Repatha with Zetia well.  Lipid panel today.

## 2023-04-06 NOTE — Assessment & Plan Note (Signed)
 BMI 33.20, is maintaining past weight loss.  Recommended eating smaller high protein, low fat meals more frequently and exercising 30 mins a day 5 times a week with a goal of 10-15lb weight loss in the next 3 months. Patient voiced their understanding and motivation to adhere to these recommendations.

## 2023-04-07 ENCOUNTER — Encounter: Payer: Self-pay | Admitting: Nurse Practitioner

## 2023-04-07 ENCOUNTER — Ambulatory Visit: Payer: Self-pay | Admitting: Nurse Practitioner

## 2023-04-07 DIAGNOSIS — E7800 Pure hypercholesterolemia, unspecified: Secondary | ICD-10-CM

## 2023-04-07 DIAGNOSIS — E66811 Obesity, class 1: Secondary | ICD-10-CM

## 2023-04-07 DIAGNOSIS — F320 Major depressive disorder, single episode, mild: Secondary | ICD-10-CM

## 2023-04-07 DIAGNOSIS — M791 Myalgia, unspecified site: Secondary | ICD-10-CM

## 2023-04-07 DIAGNOSIS — G43119 Migraine with aura, intractable, without status migrainosus: Secondary | ICD-10-CM

## 2023-04-07 DIAGNOSIS — M85851 Other specified disorders of bone density and structure, right thigh: Secondary | ICD-10-CM

## 2023-04-07 LAB — COMPREHENSIVE METABOLIC PANEL
ALT: 26 IU/L (ref 0–32)
AST: 27 IU/L (ref 0–40)
Albumin: 4.4 g/dL (ref 3.9–4.9)
Alkaline Phosphatase: 82 IU/L (ref 44–121)
BUN/Creatinine Ratio: 17 (ref 12–28)
BUN: 11 mg/dL (ref 8–27)
Bilirubin Total: 0.4 mg/dL (ref 0.0–1.2)
CO2: 23 mmol/L (ref 20–29)
Calcium: 9.6 mg/dL (ref 8.7–10.3)
Chloride: 104 mmol/L (ref 96–106)
Creatinine, Ser: 0.63 mg/dL (ref 0.57–1.00)
Globulin, Total: 2.9 g/dL (ref 1.5–4.5)
Glucose: 87 mg/dL (ref 70–99)
Potassium: 4.5 mmol/L (ref 3.5–5.2)
Sodium: 142 mmol/L (ref 134–144)
Total Protein: 7.3 g/dL (ref 6.0–8.5)
eGFR: 100 mL/min/{1.73_m2} (ref >59)

## 2023-04-07 LAB — LIPID PANEL W/O CHOL/HDL RATIO
Cholesterol, Total: 154 mg/dL (ref 100–199)
HDL: 46 mg/dL (ref >39)
LDL Chol Calc (NIH): 77 mg/dL (ref 0–99)
Triglycerides: 182 mg/dL — ABNORMAL HIGH (ref 0–149)
VLDL Cholesterol Cal: 31 mg/dL (ref 5–40)

## 2023-04-07 NOTE — Progress Notes (Signed)
Contacted via MyChart

## 2023-04-26 ENCOUNTER — Other Ambulatory Visit: Payer: Self-pay | Admitting: Obstetrics and Gynecology

## 2023-04-26 DIAGNOSIS — N951 Menopausal and female climacteric states: Secondary | ICD-10-CM

## 2023-05-27 ENCOUNTER — Other Ambulatory Visit: Payer: Self-pay | Admitting: Obstetrics and Gynecology

## 2023-05-27 ENCOUNTER — Encounter: Payer: Self-pay | Admitting: Obstetrics and Gynecology

## 2023-05-27 DIAGNOSIS — N951 Menopausal and female climacteric states: Secondary | ICD-10-CM

## 2023-06-16 ENCOUNTER — Other Ambulatory Visit (HOSPITAL_COMMUNITY)
Admission: RE | Admit: 2023-06-16 | Discharge: 2023-06-16 | Disposition: A | Discharge status: Home / Self Care | Source: Ambulatory Visit | Attending: Licensed Practical Nurse | Admitting: Licensed Practical Nurse

## 2023-06-16 ENCOUNTER — Ambulatory Visit: Admitting: Licensed Practical Nurse

## 2023-06-16 VITALS — BP 135/78 | HR 116 | Ht 65.0 in | Wt 202.9 lb

## 2023-06-16 DIAGNOSIS — Z124 Encounter for screening for malignant neoplasm of cervix: Secondary | ICD-10-CM

## 2023-06-16 DIAGNOSIS — Z01419 Encounter for gynecological examination (general) (routine) without abnormal findings: Secondary | ICD-10-CM | POA: Insufficient documentation

## 2023-06-16 NOTE — Progress Notes (Unsigned)
 Gynecology Annual Exam  PCP: Lemar Pyles, NP  Chief Complaint:  Chief Complaint  Patient presents with   Gynecologic Exam    History of Present Illness:Patient is a 63 y.o. [phone] presents for annual exam. The patient has been on HRT, did try to decrease her dose as instructed but did not like it so has been taking the full dose. She wonders how long she can be on it. She does feel "hot" often, otherwise denies menopausal symptoms.   LMP: No LMP recorded. Patient is postmenopausal.   The patient is occasionally sexually active with 1 female partner (her husband has health concerns that complicate their sex life). She denies dyspareunia.  The patient does perform self breast exams.  There is no notable family history of breast or ovarian cancer in her family.  The patient wears seatbelts: yes.   The patient has regular exercise: yes.  Walking and stationary bike, has a bothersome knee that disrupts her ability to exercise.   The patient reports current symptoms of depression.   Well managed with medication Works at a school in the front office Lives with her husband, feels safe. They have a 24 y/o daughter that visits often PCP last seen in March Dentist goes every 6 months Wears contacts, last exam 1 year ago See derm regularly    Review of Systems: ROS see HPI   Past Medical History:  Patient Active Problem List   Diagnosis Date Noted Date Diagnosed   Cervical radiculopathy 09/15/2022    Family history of MI (myocardial infarction) 08/07/2020     Mother with MI at age 26.    Family history of hyperlipidemia 08/07/2020     In mother -- who had MI at age 34.    Myalgia due to statin 01/22/2020    History of anemia 07/26/2019    Depression, major, single episode, mild (HCC) 07/26/2019    Obesity 07/26/2019    Osteopenia of neck of right femur      Sept 2017 The BMD measured at [facility] is 0.806 g/cm2 with a T-score of -1.6    Intractable migraine with  aura without status migrainosus     Hypercholesterolemia      01/13/22 = Zero coronary calcium . Low risk  -- repeat in 5 years.      Allergic rhinitis     Vocal cord polyp      Past Surgical History:  Past Surgical History:  Procedure Laterality Date   COLONOSCOPY  2015   negative   excision of polyps from vocal cords  1970, 1984, 1985    Gynecologic History:  No LMP recorded. Patient is postmenopausal. Last Pap: Results were: 2024 NILM HPV was not run, HPV Po 2022   Last mammogram: 2024 Results were: BI-RAD I  Obstetric History: [id number]  Family History:  Family History  Problem Relation Age of Onset   Diabetes Mother    Colon polyps Mother 51   Heart disease Mother        MI has pacemaker   Hypertension Mother    Heart attack Mother    Diabetes Father    Squamous cell carcinoma Sister        skin on arm   Skin cancer Maternal Aunt    Breast cancer Neg Hx     Social History:  Social History   Socioeconomic History   Marital status: Married    Spouse name: Not on file   Number of children: 1  Years of education: Not on file   Highest education level: Not on file  Occupational History   Occupation: Environmental health practitioner, Passenger transport manager  Tobacco Use   Smoking status: Never   Smokeless tobacco: Never  Vaping Use   Vaping status: Never Used  Substance and Sexual Activity   Alcohol use: Yes    Comment: 1/week   Drug use: No   Sexual activity: Yes    Birth control/protection: Post-menopausal  Other Topics Concern   Not on file  Social History Narrative   Daughter graduated from [employer] 2020 thea(took theater and communications).   Social Drivers of Corporate investment banker Strain: Low Risk  (06/15/2023)   Received from [facility]   Overall Financial Resource Strain (CARDIA)    Difficulty of Paying Living Expenses: Not hard at all  Food Insecurity: No Food Insecurity (06/15/2023)   Received from [facility]   Hunger Vital Sign    Worried  About Running Out of Food in the Last Year: Never true    Ran Out of Food in the Last Year: Never true  Transportation Needs: No Transportation Needs (06/15/2023)   Received from [facility] - Transportation    Lack of Transportation (Medical): No    Lack of Transportation (Non-Medical): No  Physical Activity: Insufficiently Active (02/03/2018)   Exercise Vital Sign    Days of Exercise per Week: 3 days    Minutes of Exercise per Session: 30 min  Stress: No Stress Concern Present (12/06/2016)   [employer] of Occupational Health - Occupational Stress Questionnaire    Feeling of Stress : Only a little  Social Connections: Moderately Integrated (12/06/2016)   Social Connection and Isolation Panel [NHANES]    Frequency of Communication with Friends and Family: Three times a week    Frequency of Social Gatherings with Friends and Family: Once a week    Attends Religious Services: More than 4 times per year    Active Member of [employer] or Organizations: No    Attends Banker Meetings: Never    Marital Status: Married  Catering manager Violence: Not At Risk (12/06/2016)   Humiliation, Afraid, Rape, and Kick questionnaire    Fear of Current or Ex-Partner: No    Emotionally Abused: No    Physically Abused: No    Sexually Abused: No    Allergies:  Allergies  Allergen Reactions   Statins Other (See Comments)    Myopathy     Medications: Prior to Admission medications   Medication Sig Start Date End Date Taking? Authorizing Provider  B Complex Vitamins (VITAMIN-B COMPLEX) TABS Take by mouth.   Yes [provider]  Calcium  Carb-Cholecalciferol (CALCIUM  CARBONATE-VITAMIN D3) 600-400 MG-UNIT TABS Take by mouth.   Yes [provider]  cetirizine (ZYRTEC) 10 MG tablet Take by mouth.   Yes [provider]  diphenhydrAMINE-APAP, sleep, (TYLENOL PM EXTRA STRENGTH PO) Take by mouth at bedtime as needed.   Yes [provider]   Esomeprazole Magnesium (NEXIUM PO) Take by mouth daily as needed.   Yes [provider]  Estradiol  (ELESTRIN ) 0.52 MG/0.87 GM (0.06%) GEL APPLY 1/2 to 1 PUMP TO UPPER ARM AREA EVERY DAY 05/27/22  Yes Copland, Alicia B, PA-C  Evolocumab  (REPATHA  SURECLICK) 140 MG/ML SOAJ Inject 140 mg into the skin every 14 (fourteen) days. 10/08/22  Yes Cannady, Jolene T, NP  ezetimibe  (ZETIA ) 10 MG tablet Take 1 tablet (10 mg total) by mouth daily. 10/08/22 10/03/23 Yes Cannady, Jolene  T, NP  Misc Natural Products (PETADOLEX 75) 75 MG CAPS Take by mouth.   Yes [provider]  Omega-3 Fatty Acids (FISH OIL) 1200 MG CAPS Take by mouth daily.   Yes [provider]  progesterone  (PROMETRIUM ) 100 MG capsule TAKE 1 CAPSULE BY MOUTH ONCE DAILY 05/27/23  Yes Copland, Alicia B, PA-C  Sennosides (SENOKOT PO) Take by mouth daily as needed.   Yes [provider]  sertraline (ZOLOFT) 100 MG tablet Take 1 tablet by mouth daily. 06/25/20  Yes [provider]  SUMAtriptan (IMITREX) 100 MG tablet Take 1 tablet by mouth as needed. 06/11/19  Yes [provider]  tretinoin (RETIN-A) 0.025 % cream Apply 1 Application topically at bedtime. 12/02/21  Yes [provider]  UBRELVY 100 MG TABS Take 1 tablet by mouth daily. 01/01/21  Yes [provider]    Physical Exam Vitals: Blood pressure 135/78, pulse (!) 116, height 5\' 5"  (1.651 m), weight 202 lb 14.4 oz (92 kg).  General: NAD HEENT: normocephalic, anicteric Thyroid: no enlargement, no palpable nodules Pulmonary: No increased work of breathing, CTAB Cardiovascular: RRR, distal pulses 2+ Breast: Breast symmetrical, no tenderness, no palpable nodules or masses, no skin or nipple retraction present, no nipple discharge.  No axillary or supraclavicular lymphadenopathy. Abdomen: NABS, soft, non-tender, non-distended.  Umbilicus without lesions.  No hepatomegaly, splenomegaly or masses palpable. No evidence of hernia   Genitourinary:  External: Normal external female genitalia.  Normal urethral meatus, normal Bartholin's and Skene's glands.    Vagina: Normal vaginal mucosa, no evidence of prolapse.    Cervix: Grossly normal in appearance, no bleeding  Uterus: Non-enlarged, mobile, normal contour.  No CMT  Adnexa: ovaries non-enlarged, no adnexal masses  Rectal: deferred  Lymphatic: no evidence of inguinal lymphadenopathy Extremities: no edema, erythema, or tenderness Neurologic: Grossly intact Psychiatric: mood appropriate, affect full  Female chaperone present for pelvic and breast  portions of the physical exam     Assessment: 63 y.o. [id number] routine annual exam  Plan: Problem List Items Addressed This Visit   None Visit Diagnoses       Well woman exam    -  Primary   Relevant Orders   Cytology - PAP     Cervical cancer screening       Relevant Orders   Cytology - PAP       1) Mammogram - recommend yearly screening mammogram.  Mammogram Was ordered today  2) STI screening  wasoffered and declined  3) ASCCP guidelines and rational discussed.  Patient opts for discontinue age >52 screening interval  4) Osteopenia managed by PCP     5) Routine healthcare maintenance including cholesterol, diabetes screening discussed managed by PCP  6) Colonoscopy up  to date, cologard 2 years ago .  Screening recommended starting at age 74 for average risk individuals, age 68 for individuals deemed at increased risk (including African Americans) and recommended to continue until age 104.  For patient age 90-85 individualized approach is recommended.  Gold standard screening is via colonoscopy, Cologuard screening is an acceptable alternative for patient unwilling or unable to undergo colonoscopy.  "Colorectal cancer screening for average?risk adults: 2018 guideline update from the American Cancer Society"CA: A Cancer Journal for Clinicians: Jun 16, 2016   7) HRT, reviewed the cardiovascular benefits  of HRT are no longer present once a person 10 years from menopause, she may want to consider weaning from HRT. Recommend weight bearing exercises to reduce bone loss. Abir will discuss  with her PCP.    Anice Kerbs, CNM   [facility] 06/16/2023, 2:14 PM

## 2023-06-21 ENCOUNTER — Other Ambulatory Visit: Payer: Self-pay | Admitting: Obstetrics and Gynecology

## 2023-06-21 DIAGNOSIS — N951 Menopausal and female climacteric states: Secondary | ICD-10-CM

## 2023-06-21 LAB — CYTOLOGY - PAP
Comment: NEGATIVE
Diagnosis: NEGATIVE
High risk HPV: NEGATIVE

## 2023-06-24 ENCOUNTER — Telehealth: Payer: Self-pay

## 2023-06-24 NOTE — Telephone Encounter (Signed)
 Copied from CRM [id number]. Topic: Clinical - Medication Prior Auth >> Jun 24, 2023 10:24 AM Crispin Dolphin wrote: Reason for CRM: Periodically insurance will not cover Evolocumab  (REPATHA  SURECLICK) 140 MG/ML SOAJ. Need to send new PA. Phone: [phone].

## 2023-06-27 ENCOUNTER — Telehealth: Payer: Self-pay

## 2023-06-27 ENCOUNTER — Other Ambulatory Visit (HOSPITAL_COMMUNITY): Payer: Self-pay

## 2023-06-27 NOTE — Telephone Encounter (Signed)
 PA request has been Submitted. New Encounter has been or will be created for follow up. For additional info see Pharmacy Prior Auth telephone encounter from 06/27/2023.

## 2023-06-27 NOTE — Telephone Encounter (Signed)
 Pharmacy Patient Advocate Encounter   Received notification from Pt Calls Messages that prior authorization for Repatha  SureClick 140MG /ML auto-injectors is required/requested.   Insurance verification completed.   The patient is insured through [facility] .   Per test claim: PA required; PA submitted to above mentioned insurance via CoverMyMeds Key/confirmation #/EOC [id number] Status is pending

## 2023-06-28 ENCOUNTER — Other Ambulatory Visit (HOSPITAL_COMMUNITY): Payer: Self-pay

## 2023-06-28 NOTE — Telephone Encounter (Signed)
 PA request has been Approved. Pharmacy filled med for pt 06/27/2023, should be ready at pt's pharmacy.

## 2023-06-28 NOTE — Telephone Encounter (Signed)
 Called and LVM notifying patient of approval and that medication should be ready for pick up.

## 2023-06-28 NOTE — Telephone Encounter (Signed)
 Pharmacy Patient Advocate Encounter  Received notification from [employer] that Prior Authorization for Repatha  SureClick 140MG /ML auto-injectors has been APPROVED from 06/27/2023 to Until further notice (01/17/2098). Unable to obtain price due to refill too soon rejection, last fill date 06/27/2023 next available fill date 07/18/2023   PA #/Case ID/Reference #: [id number]

## 2023-07-18 ENCOUNTER — Other Ambulatory Visit: Payer: Self-pay | Admitting: Nurse Practitioner

## 2023-07-18 DIAGNOSIS — Z1231 Encounter for screening mammogram for malignant neoplasm of breast: Secondary | ICD-10-CM

## 2023-07-27 ENCOUNTER — Other Ambulatory Visit: Payer: Self-pay | Admitting: Nurse Practitioner

## 2023-07-27 ENCOUNTER — Other Ambulatory Visit: Payer: Self-pay | Admitting: Obstetrics and Gynecology

## 2023-07-27 DIAGNOSIS — N951 Menopausal and female climacteric states: Secondary | ICD-10-CM

## 2023-07-29 NOTE — Telephone Encounter (Signed)
 Requested Prescriptions  Pending Prescriptions Disp Refills   REPATHA  SURECLICK 140 MG/ML SOAJ [Pharmacy Med Name: REPATHA  SURECLICK 140 MG/ML SUBQ SO] 2 mL 5    Sig: INJECT 140 MG INTO THE SKIN EVERY 14 DAYS.     Cardiovascular: PCSK9 Inhibitors Failed - 07/29/2023 11:08 AM      Failed - Lipid Panel completed within the last 12 months    Cholesterol, Total  Date Value Ref Range Status  04/06/2023 154 100 - 199 mg/dL Final   LDL Chol Calc (NIH)  Date Value Ref Range Status  04/06/2023 77 0 - 99 mg/dL Final   HDL  Date Value Ref Range Status  04/06/2023 46 >39 mg/dL Final   Triglycerides  Date Value Ref Range Status  04/06/2023 182 (H) 0 - 149 mg/dL Final         Passed - Valid encounter within last 12 months    Recent Outpatient Visits           3 months ago Hypercholesterolemia   [place] [facility] [place], Melanie DASEN, NP

## 2023-08-05 ENCOUNTER — Ambulatory Visit
Admission: RE | Admit: 2023-08-05 | Discharge: 2023-08-05 | Disposition: A | Discharge status: Home / Self Care | Source: Ambulatory Visit | Attending: Nurse Practitioner | Admitting: Nurse Practitioner

## 2023-08-05 DIAGNOSIS — Z1231 Encounter for screening mammogram for malignant neoplasm of breast: Secondary | ICD-10-CM | POA: Insufficient documentation

## 2023-09-30 ENCOUNTER — Encounter: Payer: Self-pay | Admitting: Nurse Practitioner

## 2023-09-30 ENCOUNTER — Telehealth: Payer: Self-pay | Admitting: Nurse Practitioner

## 2023-09-30 MED ORDER — NIRMATRELVIR/RITONAVIR (PAXLOVID)TABLET: 3.0000 | ORAL_TABLET | Freq: Two times a day (BID) | ORAL | 0 refills | Status: AC

## 2023-09-30 NOTE — Telephone Encounter (Signed)
 Spoke to patient via telephone, review encounter 09/30/23.

## 2023-09-30 NOTE — Telephone Encounter (Signed)
 Spoke to patient on phone this evening and discussed current symptoms.  Had exposure to family members with Covid, her symptoms started two days ago and tested positive today.  Discussed via telephone treating with Paxlovid  and educated on this.  She would like script sent in as is having fever and feeling bad.  Script sent to [facility].  Recent eGFR 100.

## 2023-10-12 ENCOUNTER — Emergency Department
Admission: EM | Admit: 2023-10-12 | Discharge: 2023-10-12 | Disposition: A | Discharge status: Home / Self Care | Attending: Emergency Medicine | Admitting: Emergency Medicine

## 2023-10-12 ENCOUNTER — Emergency Department

## 2023-10-12 ENCOUNTER — Other Ambulatory Visit: Payer: Self-pay

## 2023-10-12 ENCOUNTER — Encounter: Payer: Self-pay | Admitting: Intensive Care

## 2023-10-12 DIAGNOSIS — M7989 Other specified soft tissue disorders: Secondary | ICD-10-CM | POA: Diagnosis present

## 2023-10-12 LAB — CBC WITH DIFFERENTIAL/PLATELET
Abs Immature Granulocytes: 0.03 K/uL (ref 0.00–0.07)
Basophils Absolute: 0.1 K/uL (ref 0.0–0.1)
Basophils Relative: 1 %
Eosinophils Absolute: 0.2 K/uL (ref 0.0–0.5)
Eosinophils Relative: 2 %
HCT: 32.6 % — ABNORMAL LOW (ref 36.0–46.0)
Hemoglobin: 10.3 g/dL — ABNORMAL LOW (ref 12.0–15.0)
Immature Granulocytes: 0 %
Lymphocytes Relative: 25 %
Lymphs Abs: 2.5 K/uL (ref 0.7–4.0)
MCH: 29.2 pg (ref 26.0–34.0)
MCHC: 31.6 g/dL (ref 30.0–36.0)
MCV: 92.4 fL (ref 80.0–100.0)
Monocytes Absolute: 0.5 K/uL (ref 0.1–1.0)
Monocytes Relative: 5 %
Neutro Abs: 6.7 K/uL (ref 1.7–7.7)
Neutrophils Relative %: 67 %
Platelets: 655 K/uL — ABNORMAL HIGH (ref 150–400)
RBC: 3.53 MIL/uL — ABNORMAL LOW (ref 3.87–5.11)
RDW: 13.2 % (ref 11.5–15.5)
WBC: 9.9 K/uL (ref 4.0–10.5)
nRBC: 0 % (ref 0.0–0.2)

## 2023-10-12 LAB — COMPREHENSIVE METABOLIC PANEL WITH GFR
ALT: 18 U/L (ref 0–44)
AST: 26 U/L (ref 15–41)
Albumin: 3.2 g/dL — ABNORMAL LOW (ref 3.5–5.0)
Alkaline Phosphatase: 135 U/L — ABNORMAL HIGH (ref 38–126)
Anion gap: 13 (ref 5–15)
BUN: 17 mg/dL (ref 8–23)
CO2: 22 mmol/L (ref 22–32)
Calcium: 9 mg/dL (ref 8.9–10.3)
Chloride: 103 mmol/L (ref 98–111)
Creatinine, Ser: 0.72 mg/dL (ref 0.44–1.00)
GFR, Estimated: >60 mL/min (ref >60)
Glucose, Bld: 121 mg/dL — ABNORMAL HIGH (ref 70–99)
Potassium: 3.8 mmol/L (ref 3.5–5.1)
Sodium: 138 mmol/L (ref 135–145)
Total Bilirubin: 0.3 mg/dL (ref 0.0–1.2)
Total Protein: 8.3 g/dL — ABNORMAL HIGH (ref 6.5–8.1)

## 2023-10-12 LAB — PROTIME-INR
INR: 1 (ref 0.8–1.2)
Prothrombin Time: 13.2 s (ref 11.4–15.2)

## 2023-10-12 LAB — APTT: aPTT: 42 s — ABNORMAL HIGH (ref 24–36)

## 2023-10-12 NOTE — ED Provider Notes (Signed)
 APP supervisory note  63 year old female presenting with right calf swelling.  Had back surgery in July, noted Discomfort 3 days ago.  It was recommended that she present to the ER for DVT evaluation.  Ultrasound here was without evidence of DVT but did demonstrate a fluid collection in the proximal medial calf possibly related to seroma or chronic hematoma, less likely abscess. Labs with mild anemia, thrombocytosis, normal INR.  PTT minimally elevated.  CMP without significant derangement.  Patient was evaluated at bedside.  Intact DP pulses.  Has some swelling and mild warmth of her right lower extremity compared to the contralateral side.  There are no focal overlying skin changes.  There is no palpable fluctuance.  I overall have very low suspicion for an abscess.  Clinical presentation is overall not suggestive of cellulitis.  Consideration for seroma or hematoma from recent minor trauma though patient cannot identify any specific injury.  I did consider empiric antibiotics, but overall lower suspicion for infectious process and patient is comfortable with holding off and outpatient follow-up.  Strict return precautions provided.  Patient discharged in stable condition.     Levander Slate, MD 10/12/23 [phone]

## 2023-10-12 NOTE — Discharge Instructions (Addendum)
 You were seen in the emergency department for right calf swelling.  You do not have a blood clot in your leg.  This looks like it could be a collection of fluid called a seroma or collection of blood called a hematoma.  I have given you some instructions to read over.  These collections of fluid most likely occur from trauma to your leg where you hit your leg on something, but it seems it may have been spontaneously occurred as you stated you did not have any trauma.  You may take Tylenol or Motrin at home as needed for pain unless your doctors told you not to take these medications.  Please follow-up with your primary care provider following today's visit.  If you start to develop fever, chills, redness or significantly increased swelling or intolerable pain to your leg, chest pain or shortness of breath please return to the emergency department immediately.

## 2023-10-12 NOTE — ED Triage Notes (Signed)
 Patient c/o right leg swelling. July 27th had back surgery. Sent by doctor for US  of leg

## 2023-10-12 NOTE — ED Provider Notes (Addendum)
 [facility] Provider Note    Event Date/Time   First MD Initiated Contact with Patient 10/12/23 1307     (approximate)   History   Leg Swelling   HPI  Margaret Fuller is a 63 y.o. female  with a past medical history of hypercholesterolemia, anemia, osteopenia, migraines, allergic rhinitis presents to the emergency department with right calf swelling and pain with walking x 3 days.  Patient states she had recent back surgery (L4-5 trans foraminal lumbar interbody fusion) on July 22nd of this year.  She contacted her surgeon on MyChart who stated to come to the emergency department for Doppler ultrasound of her right lower extremity.  Patient denies fall or injury, bumping her leg onto any object, chest pain, shortness of breath, fever or chills, hip or ankle pain.  Reports both legs have been warm.  Patient has been ambulating without assistance since the surgery.  Patient has not smoke.  Patient is prescribed estradiol  gel. Patient not on any blood thinners.   Physical Exam   Triage Vital Signs: ED Triage Vitals [10/12/23 1249]  Encounter Vitals Group     BP (!) 141/85     Girls Systolic BP Percentile      Girls Diastolic BP Percentile      Boys Systolic BP Percentile      Boys Diastolic BP Percentile      Pulse Rate (!) 110     Resp 18     Temp 98.6 F (37 C)     Temp Source Oral     SpO2 94 %     Weight 200 lb (90.7 kg)     Height 5' 5.25 (1.657 m)     Head Circumference      Peak Flow      Pain Score 0     Pain Loc      Pain Education      Exclude from Growth Chart     Most recent vital signs: Vitals:   10/12/23 1310 10/12/23 1528  BP:  121/63  Pulse:  92  Resp:  18  Temp:    SpO2: 100% 100%    General: Awake, in no acute distress. Appears stated age. CV: Good peripheral perfusion. Cap refill <2 sec b/l. DP and PT pulses 2+ b/l. Respiratory:Normal respiratory effort.  No respiratory distress. CTAB.  GI: Soft,  non-distended, non-tender.  MSK: Normal ROM and  5/5 strength in b/l lower extremities. TTP of right medial proximal calf. Skin:Warm, dry, intact. 2 cm difference in right calf compared to left calf.  No surrounding erythema, wound, or point of fluctuance. Positive Homan's sign on the right.   ED Results / Procedures / Treatments   Labs (all labs ordered are listed, but only abnormal results are displayed) Labs Reviewed  CBC WITH DIFFERENTIAL/PLATELET - Abnormal; Notable for the following components:      Result Value   RBC 3.53 (*)    Hemoglobin 10.3 (*)    HCT 32.6 (*)    Platelets 655 (*)    All other components within normal limits  COMPREHENSIVE METABOLIC PANEL WITH GFR - Abnormal; Notable for the following components:   Glucose, Bld 121 (*)    Total Protein 8.3 (*)    Albumin 3.2 (*)    Alkaline Phosphatase 135 (*)    All other components within normal limits  APTT - Abnormal; Notable for the following components:   aPTT 42 (*)    All other components within  normal limits  PROTIME-INR     EKG     RADIOLOGY Doppler imaging of right lower extremity ordered. IMPRESSION: 1. No right lower extremity DVT. 2. 13.0 x 0.7 x 3.3 cm fluid collection in the proximal medial calf likely related to a seroma or subacute to chronic hematoma. An abscess could have a similar appearance in the appropriate clinical setting, but is considered less likely.  PROCEDURES:  Critical Care performed: No   Procedures   MEDICATIONS ORDERED IN ED: Medications - No data to display   IMPRESSION / MDM / ASSESSMENT AND PLAN / ED COURSE  I reviewed the triage vital signs and the nursing notes.                              Differential diagnosis includes, but is not limited to, seroma, hematoma, abscess, DVT  Patient's presentation is most consistent with acute complicated illness / injury requiring diagnostic workup.  Patient is a 63 year old female presenting with right calf  swelling that started 3 days ago.  All compartments are soft.  No evidence of any wound or cellulitic appearance.  No fever or chills.  No shortness of breath.  She does not have any respiratory distress, lungs are clear.  SpO2 is 100% and respirations are 18.  CBC, CMP, APTT and pro time INR ordered in triage. Platelet count is 655, Hgb 10.3. Alk Phos 135, APTT 42. PT INR within normal limits.  Discussed these findings Doppler ultrasound is negative for DVT however there is possible seroma versus hematoma on exam.  Patient does not have any overlying skin changes, no fever, no significant pain, able to ambulate without assistance, nontoxic appearing.  She does not remember bumping her leg onto anything.  Had Dr. Nilsa Dade, supervising physician, evaluate her as well and offered antibiotics, but patient declined at this time.  Did discuss lab findings with Dr. Dade, she stated patient was okay for discharge.  Will have her follow-up with her PCP outpatient regarding her lab results and to see if her right leg swelling does decrease over time.  The patient may return to the emergency department for any new, worsening, or concerning symptoms. Patient was given the opportunity to ask questions; all questions were answered. Emergency department return precautions were discussed with the patient.  Patient is in agreement to the treatment plan.  Patient is stable for discharge.    FINAL CLINICAL IMPRESSION(S) / ED DIAGNOSES   Final diagnoses:  Right leg swelling     Rx / DC Orders   ED Discharge Orders     None        Note:  This document was prepared using Dragon voice recognition software and may include unintentional dictation errors.     Sheron Salm, PA-C 10/12/23 1721    [address], [place], PA-C 10/12/23 1724    Dade Nilsa, MD 10/12/23 [phone]

## 2023-10-14 ENCOUNTER — Other Ambulatory Visit

## 2023-10-14 ENCOUNTER — Encounter: Admitting: Nurse Practitioner

## 2023-10-17 ENCOUNTER — Encounter: Admitting: Nurse Practitioner

## 2023-10-22 IMAGING — MG MM DIGITAL SCREENING BILAT W/ TOMO AND CAD
8 series · 8 of 24 positions shown · non-contrast
Comparison: Previous exam(s).

CLINICAL DATA: Screening.

EXAM:
DIGITAL SCREENING BILATERAL MAMMOGRAM WITH TOMOSYNTHESIS AND CAD
TECHNIQUE: Bilateral screening digital craniocaudal and mediolateral oblique
mammograms were obtained. Bilateral screening digital breast
tomosynthesis was performed. The images were evaluated with
computer-aided detection.

[R MLO synth-2D]
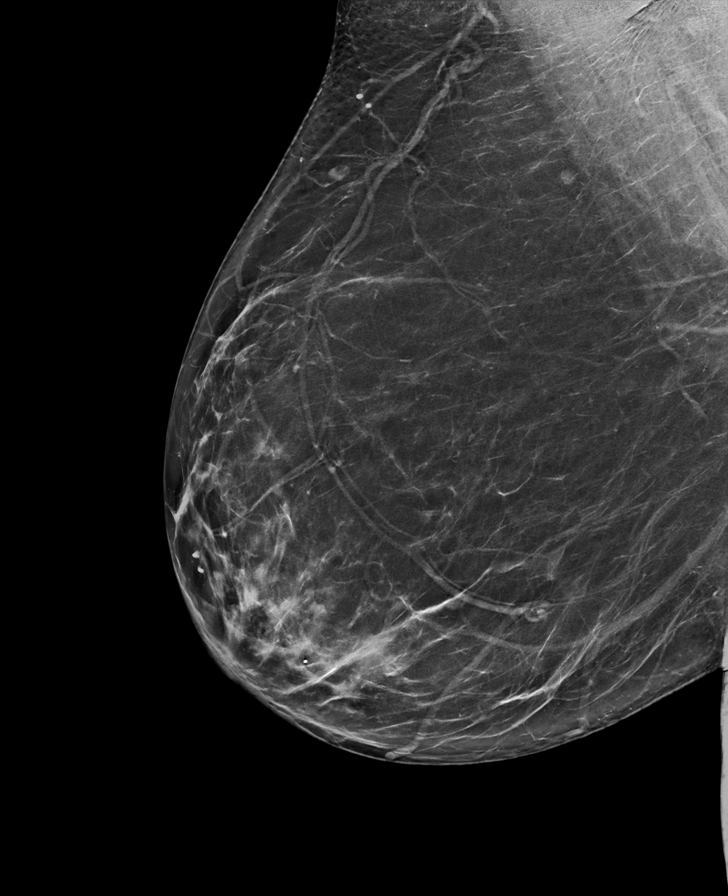

[L CC synth-2D]
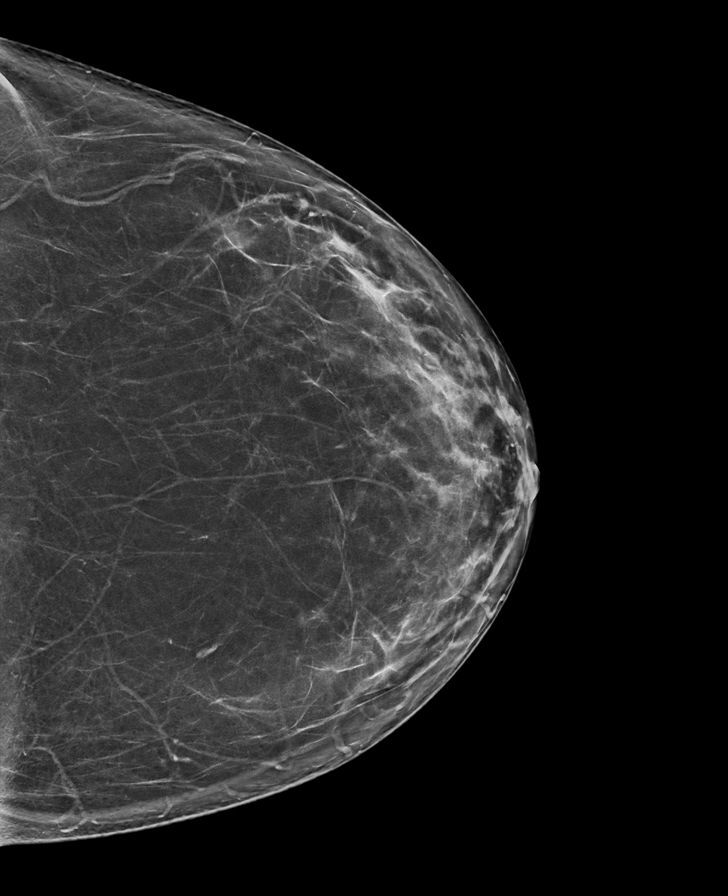

[R CC synth-2D]
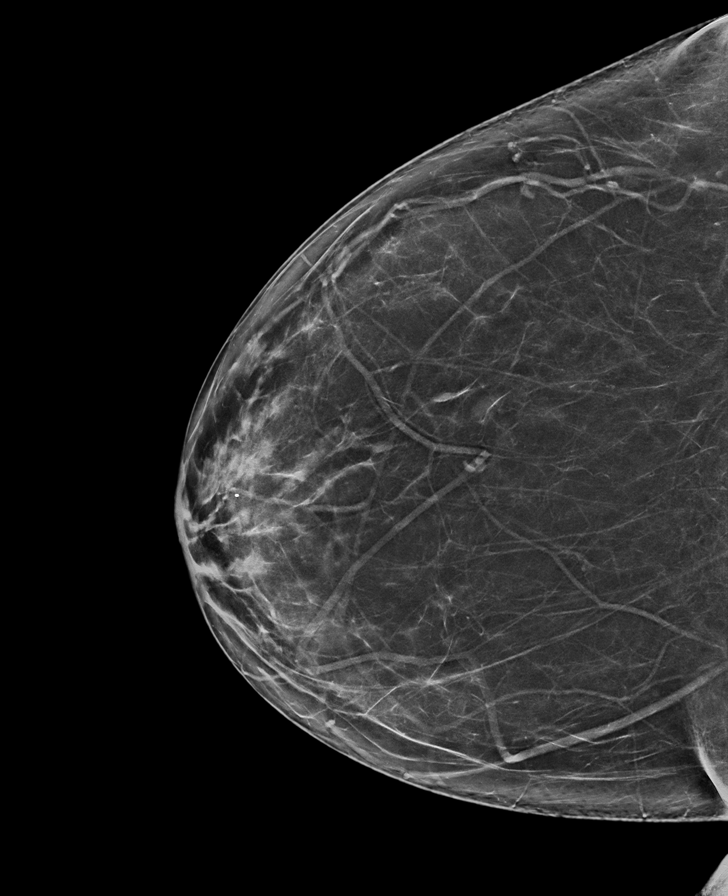

[L MLO synth-2D]
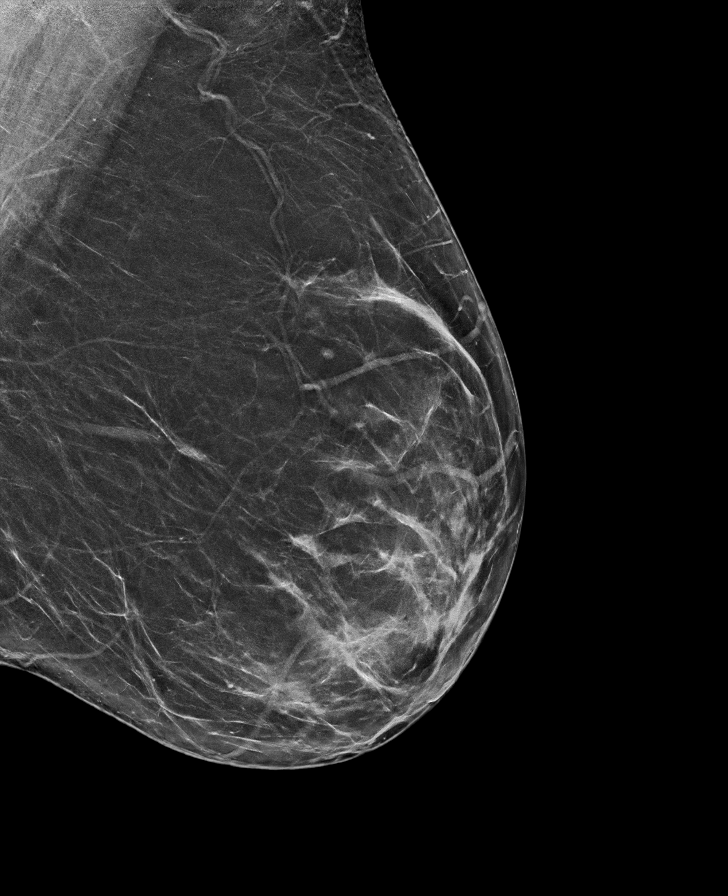

[R MLO tomo · tomo slice 43/84.0]
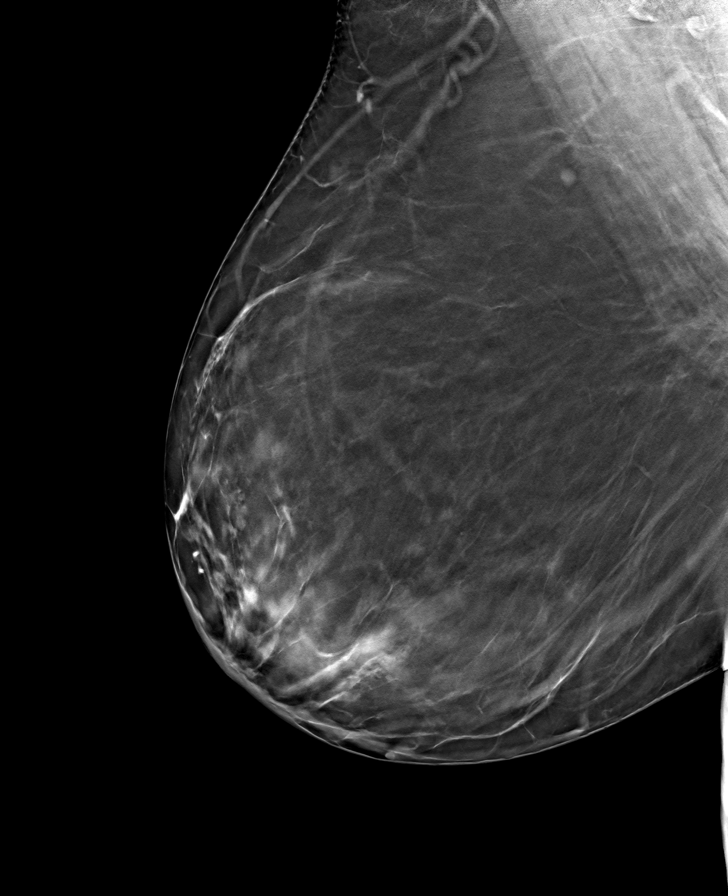

[L CC tomo · tomo slice 42/83.0]
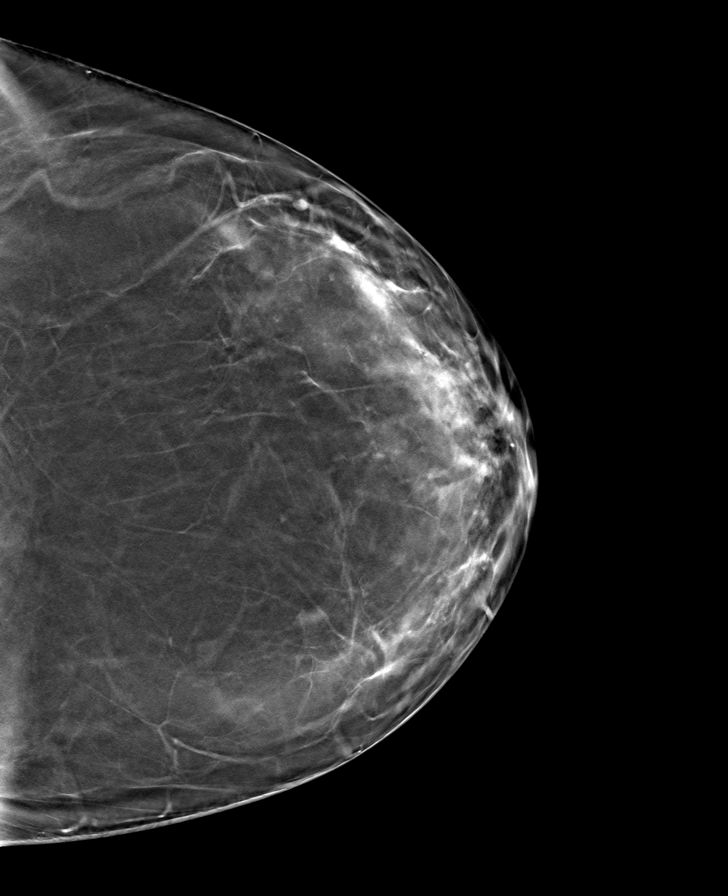

[L MLO tomo · tomo slice 41/82.0]
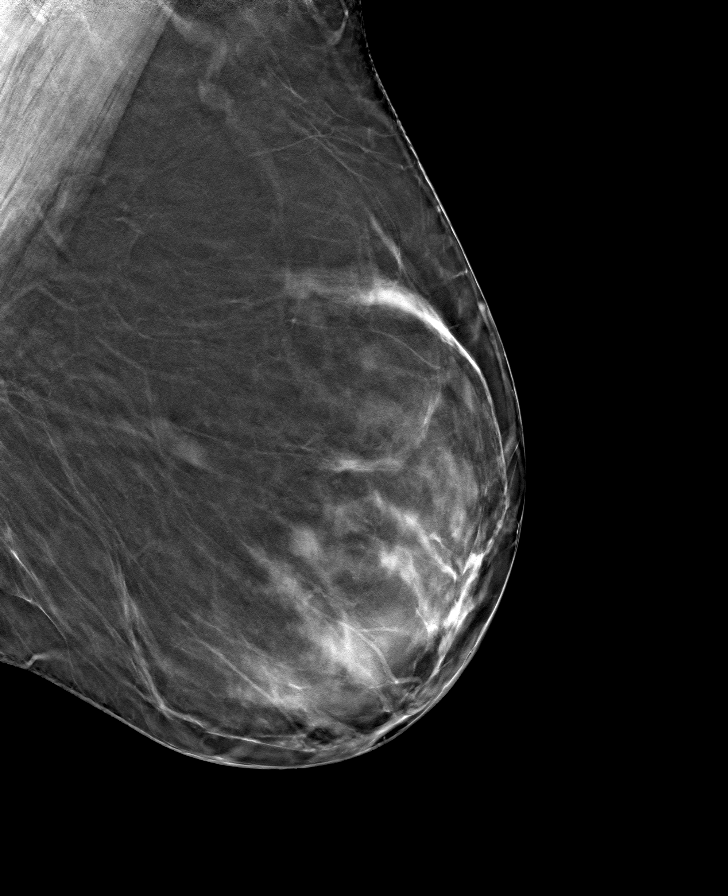

[R CC tomo · tomo slice 41/80.0]
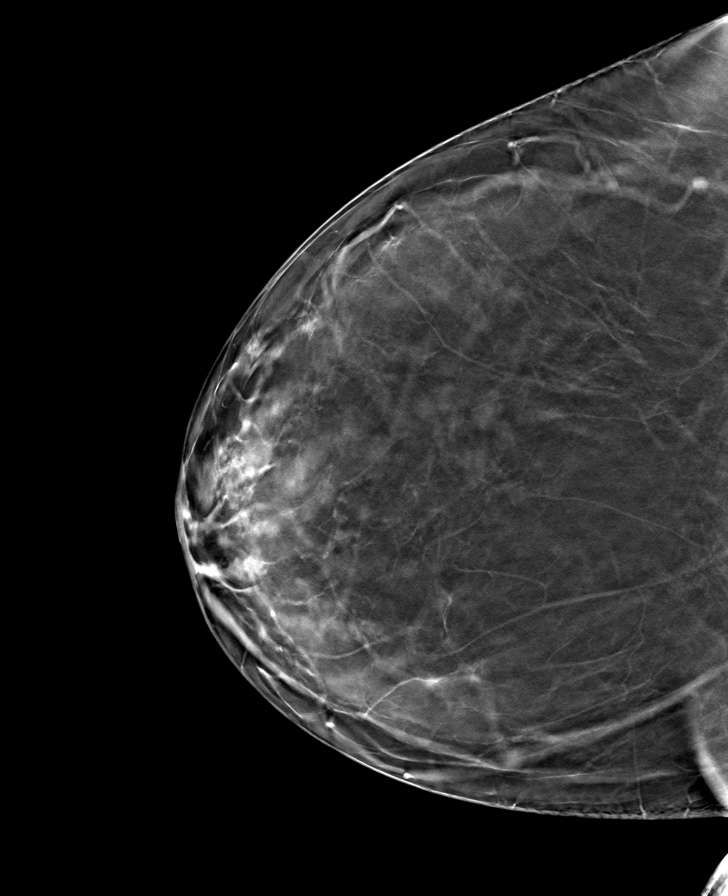

[8 of 24 positions shown; findings below may reference images not displayed]

ACR Breast Density Category b: There are scattered areas of
fibroglandular density.
FINDINGS: There are no findings suspicious for malignancy.
IMPRESSION: No mammographic evidence of malignancy. A result letter of this
screening mammogram will be mailed directly to the patient.

RECOMMENDATION:
Screening mammogram in one year. (Code:51-O-LD2)

BI-RADS CATEGORY  1: Negative.

## 2023-10-29 ENCOUNTER — Other Ambulatory Visit: Payer: Self-pay | Admitting: Nurse Practitioner

## 2023-10-29 ENCOUNTER — Other Ambulatory Visit: Payer: Self-pay | Admitting: Licensed Practical Nurse

## 2023-10-29 DIAGNOSIS — N951 Menopausal and female climacteric states: Secondary | ICD-10-CM

## 2023-11-01 NOTE — Telephone Encounter (Signed)
 Expired Rx- appointment 11/17/23 Requested Prescriptions  Pending Prescriptions Disp Refills   ezetimibe  (ZETIA ) 10 MG tablet [Pharmacy Med Name: EZETIMIBE  10 MG TAB] 90 tablet 4    Sig: TAKE 1 TABLET BY MOUTH DAILY.     Cardiovascular:  Antilipid - Sterol Transport Inhibitors Failed - 11/01/2023 11:23 AM      Failed - Lipid Panel in normal range within the last 12 months    Cholesterol, Total  Date Value Ref Range Status  04/06/2023 154 100 - 199 mg/dL Final   LDL Chol Calc (NIH)  Date Value Ref Range Status  04/06/2023 77 0 - 99 mg/dL Final   HDL  Date Value Ref Range Status  04/06/2023 46 >39 mg/dL Final   Triglycerides  Date Value Ref Range Status  04/06/2023 182 (H) 0 - 149 mg/dL Final         Passed - AST in normal range and within 360 days    AST  Date Value Ref Range Status  10/12/2023 26 15 - 41 U/L Final         Passed - ALT in normal range and within 360 days    ALT  Date Value Ref Range Status  10/12/2023 18 0 - 44 U/L Final         Passed - Patient is not pregnant      Passed - Valid encounter within last 12 months    Recent Outpatient Visits           6 months ago Hypercholesterolemia   [place] [facility] [place], Melanie DASEN, NP

## 2023-11-13 NOTE — Patient Instructions (Signed)
 Be Involved in Caring For Your Health:  Taking Medications When medications are taken as directed, they can greatly improve your health. But if they are not taken as prescribed, they may not work. In some cases, not taking them correctly can be harmful. To help ensure your treatment remains effective and safe, understand your medications and how to take them. Bring your medications to each visit for review by your provider.  Your lab results, notes, and after visit summary will be available on My Chart. We strongly encourage you to use this feature. If lab results are abnormal the clinic will contact you with the appropriate steps. If the clinic does not contact you assume the results are satisfactory. You can always view your results on My Chart. If you have questions regarding your health or results, please contact the clinic during office hours. You can also ask questions on My Chart.  We at [facility] are grateful that you chose [place] to provide your care. We strive to provide evidence-based and compassionate care and are always looking for feedback. If you get a survey from the clinic please complete this so we can hear your opinions.  Heart-Healthy Eating Plan Many factors influence your heart health, including eating and exercise habits. Heart health is also called coronary health. Coronary risk increases with abnormal blood fat (lipid) levels. A heart-healthy eating plan includes limiting unhealthy fats, increasing healthy fats, limiting salt (sodium) intake, and making other diet and lifestyle changes. What is my plan? Your health care provider may recommend that: You limit your fat intake to _________% or less of your total calories each day. You limit your saturated fat intake to _________% or less of your total calories each day. You limit the amount of cholesterol in your diet to less than _________ mg per day. You limit the amount of sodium in your diet to less than _________  mg per day. What are tips for following this plan? Cooking Cook foods using methods other than frying. Baking, boiling, grilling, and broiling are all good options. Other ways to reduce fat include: Removing the skin from poultry. Removing all visible fats from meats. Steaming vegetables in water or broth. Meal planning  At meals, imagine dividing your plate into fourths: Fill one-half of your plate with vegetables and green salads. Fill one-fourth of your plate with whole grains. Fill one-fourth of your plate with lean protein foods. Eat 2-4 cups of vegetables per day. One cup of vegetables equals 1 cup (91 g) broccoli or cauliflower florets, 2 medium carrots, 1 large bell pepper, 1 large sweet potato, 1 large tomato, 1 medium white potato, 2 cups (150 g) raw leafy greens. Eat 1-2 cups of fruit per day. One cup of fruit equals 1 small apple, 1 large banana, 1 cup (237 g) mixed fruit, 1 large orange,  cup (82 g) dried fruit, 1 cup (240 mL) 100% fruit juice. Eat more foods that contain soluble fiber. Examples include apples, broccoli, carrots, beans, peas, and barley. Aim to get 25-30 g of fiber per day. Increase your consumption of legumes, nuts, and seeds to 4-5 servings per week. One serving of dried beans or legumes equals  cup (90 g) cooked, 1 serving of nuts is  oz (12 almonds, 24 pistachios, or 7 walnut halves), and 1 serving of seeds equals  oz (8 g). Fats Choose healthy fats more often. Choose monounsaturated and polyunsaturated fats, such as olive and canola oils, avocado oil, flaxseeds, walnuts, almonds, and seeds. Eat  more omega-3 fats. Choose salmon, mackerel, sardines, tuna, flaxseed oil, and ground flaxseeds. Aim to eat fish at least 2 times each week. Check food labels carefully to identify foods with trans fats or high amounts of saturated fat. Limit saturated fats. These are found in animal products, such as meats, butter, and cream. Plant sources of saturated fats  include palm oil, palm kernel oil, and coconut oil. Avoid foods with partially hydrogenated oils in them. These contain trans fats. Examples are stick margarine, some tub margarines, cookies, crackers, and other baked goods. Avoid fried foods. General information Eat more home-cooked food and less restaurant, buffet, and fast food. Limit or avoid alcohol. Limit foods that are high in added sugar and simple starches such as foods made using white refined flour (white breads, pastries, sweets). Lose weight if you are overweight. Losing just 5-10% of your body weight can help your overall health and prevent diseases such as diabetes and heart disease. Monitor your sodium intake, especially if you have high blood pressure. Talk with your health care provider about your sodium intake. Try to incorporate more vegetarian meals weekly. What foods should I eat? Fruits All fresh, canned (in natural juice), or frozen fruits. Vegetables Fresh or frozen vegetables (raw, steamed, roasted, or grilled). Green salads. Grains Most grains. Choose whole wheat and whole grains most of the time. Rice and pasta, including brown rice and pastas made with whole wheat. Meats and other proteins Lean, well-trimmed beef, veal, pork, and lamb. Chicken and [place] without skin. All fish and shellfish. Wild duck, rabbit, pheasant, and venison. Egg whites or low-cholesterol egg substitutes. Dried beans, peas, lentils, and tofu. Seeds and most nuts. Dairy Low-fat or nonfat cheeses, including ricotta and mozzarella. Skim or 1% milk (liquid, powdered, or evaporated). Buttermilk made with low-fat milk. Nonfat or low-fat yogurt. Fats and oils Non-hydrogenated (trans-free) margarines. Vegetable oils, including soybean, sesame, sunflower, olive, avocado, peanut, safflower, corn, canola, and cottonseed. Salad dressings or mayonnaise made with a vegetable oil. Beverages Water (mineral or sparkling). Coffee and tea. Unsweetened ice  tea. Diet beverages. Sweets and desserts Sherbet, gelatin, and fruit ice. Small amounts of dark chocolate. Limit all sweets and desserts. Seasonings and condiments All seasonings and condiments. The items listed above may not be a complete list of foods and beverages you can eat. Contact a dietitian for more options. What foods should I avoid? Fruits Canned fruit in heavy syrup. Fruit in cream or butter sauce. Fried fruit. Limit coconut. Vegetables Vegetables cooked in cheese, cream, or butter sauce. Fried vegetables. Grains Breads made with saturated or trans fats, oils, or whole milk. Croissants. Sweet rolls. Donuts. High-fat crackers, such as cheese crackers and chips. Meats and other proteins Fatty meats, such as hot dogs, ribs, sausage, bacon, rib-eye roast or steak. High-fat deli meats, such as salami and bologna. Caviar. Domestic duck and goose. Organ meats, such as liver. Dairy Cream, sour cream, cream cheese, and creamed cottage cheese. Whole-milk cheeses. Whole or 2% milk (liquid, evaporated, or condensed). Whole buttermilk. Cream sauce or high-fat cheese sauce. Whole-milk yogurt. Fats and oils Meat fat, or shortening. Cocoa butter, hydrogenated oils, palm oil, coconut oil, palm kernel oil. Solid fats and shortenings, including bacon fat, salt pork, lard, and butter. Nondairy cream substitutes. Salad dressings with cheese or sour cream. Beverages Regular sodas and any drinks with added sugar. Sweets and desserts Frosting. Pudding. Cookies. Cakes. Pies. Milk chocolate or white chocolate. Buttered syrups. Full-fat ice cream or ice cream drinks. The items listed above may  not be a complete list of foods and beverages to avoid. Contact a dietitian for more information. Summary Heart-healthy meal planning includes limiting unhealthy fats, increasing healthy fats, limiting salt (sodium) intake and making other diet and lifestyle changes. Lose weight if you are overweight. Losing just  5-10% of your body weight can help your overall health and prevent diseases such as diabetes and heart disease. Focus on eating a balance of foods, including fruits and vegetables, low-fat or nonfat dairy, lean protein, nuts and legumes, whole grains, and heart-healthy oils and fats. This information is not intended to replace advice given to you by your health care provider. Make sure you discuss any questions you have with your health care provider. Document Revised: 02/09/2021 Document Reviewed: 02/09/2021 [employer] Patient Education  2024 [employer].

## 2023-11-17 ENCOUNTER — Encounter: Payer: Self-pay | Admitting: Nurse Practitioner

## 2023-11-17 ENCOUNTER — Ambulatory Visit (INDEPENDENT_AMBULATORY_CARE_PROVIDER_SITE_OTHER): Admitting: Nurse Practitioner

## 2023-11-17 VITALS — BP 126/79 | HR 96 | Temp 98.7°F | Ht 65.4 in | Wt 202.0 lb

## 2023-11-17 DIAGNOSIS — M85851 Other specified disorders of bone density and structure, right thigh: Secondary | ICD-10-CM

## 2023-11-17 DIAGNOSIS — E6609 Other obesity due to excess calories: Secondary | ICD-10-CM

## 2023-11-17 DIAGNOSIS — F320 Major depressive disorder, single episode, mild: Secondary | ICD-10-CM

## 2023-11-17 DIAGNOSIS — G43119 Migraine with aura, intractable, without status migrainosus: Secondary | ICD-10-CM | POA: Diagnosis not present

## 2023-11-17 DIAGNOSIS — Z981 Arthrodesis status: Secondary | ICD-10-CM | POA: Insufficient documentation

## 2023-11-17 DIAGNOSIS — M791 Myalgia, unspecified site: Secondary | ICD-10-CM

## 2023-11-17 DIAGNOSIS — Z23 Encounter for immunization: Secondary | ICD-10-CM

## 2023-11-17 DIAGNOSIS — Z79890 Hormone replacement therapy (postmenopausal): Secondary | ICD-10-CM

## 2023-11-17 DIAGNOSIS — T466X5A Adverse effect of antihyperlipidemic and antiarteriosclerotic drugs, initial encounter: Secondary | ICD-10-CM

## 2023-11-17 DIAGNOSIS — Z0000 Encounter for general adult medical examination without abnormal findings: Secondary | ICD-10-CM | POA: Diagnosis not present

## 2023-11-17 DIAGNOSIS — E7800 Pure hypercholesterolemia, unspecified: Secondary | ICD-10-CM

## 2023-11-17 DIAGNOSIS — N951 Menopausal and female climacteric states: Secondary | ICD-10-CM | POA: Insufficient documentation

## 2023-11-17 DIAGNOSIS — E66811 Obesity, class 1: Secondary | ICD-10-CM

## 2023-11-17 MED ORDER — EZETIMIBE 10 MG PO TABS: 10.0000 mg | ORAL_TABLET | Freq: Every day | ORAL | 4 refills | Status: AC

## 2023-11-17 MED ORDER — FUROSEMIDE 20 MG PO TABS: 10.0000 mg | ORAL_TABLET | Freq: Every day | ORAL | 0 refills | Status: AC | PRN

## 2023-11-17 NOTE — Assessment & Plan Note (Signed)
 BMI 33.20, is maintaining past weight loss.  Recommended eating smaller high protein, low fat meals more frequently and exercising 30 mins a day 5 times a week with a goal of 10-15lb weight loss in the next 3 months. Patient voiced their understanding and motivation to adhere to these recommendations.

## 2023-11-17 NOTE — Assessment & Plan Note (Signed)
 Noted on DEXA in 2017 with T -1.6.  Continue daily supplements, Calcium  and Vitamin D .  Plan to repeat DEXA next year at physical, will be 64.  Check Vit D level today.

## 2023-11-17 NOTE — Assessment & Plan Note (Signed)
Chronic, stable with minimal migraines.  Followed by neurology, continue this collaboration and current medication regimen as prescribed by them.  Return in 6 months.

## 2023-11-17 NOTE — Assessment & Plan Note (Signed)
Has history of poor tolerance to multiple statins & Zetia, but currently tolerating Repatha with Zetia well.  Lipid panel today.

## 2023-11-17 NOTE — Assessment & Plan Note (Signed)
Chronic, ongoing.  Continue this regimen and adjust as needed.  Stable on regimen at this time.  Denies SI/HI.  Return in 6 months.

## 2023-11-17 NOTE — Progress Notes (Signed)
 BP 126/79   Pulse 96   Temp 98.7 F (37.1 C) (Oral)   Ht 5' 5.4 (1.661 m)   Wt 202 lb (91.6 kg)   SpO2 96%   BMI 33.20 kg/m    Subjective:    Patient ID: Margaret Fuller, female    DOB: [birth year], 63 y.o.   MRN: [id number]  HPI: Margaret Fuller is a 63 y.o. female presenting on 11/17/2023 for comprehensive medical examination. Current medical complaints include:none  She currently lives with: husband Menopausal Symptoms: yes  Continues Elestrin  and Prometrium  for menopausal symptoms, saw GYN last for this on 06/16/23. Plans on stopping these once completed course, as did well without for awhile.  Had fusion lumbar spine L4-L5, August 09, 2023. Has been doing well since then. Follows with [facility], saw them for follow-up yesterday.  Having some swelling to both arms, ortho discussed alerting PCP for fluid pill.  MIGRAINES Saw neurology last on 07/18/23.  Continues on Ubrelvy and Imitrex. These are improved recently. Duration: years Onset: gradual Frequency: intermittent Location: right side of head -- behind eye  Headache duration: Imitrex helps Radiation: no Time of day headache occurs: varies Alleviating factors: Ubrelvy, Imitrex Aggravating factors: back pain, weather shifts Headache status at time of visit: asymptomatic Treatments attempted: triptans   Aura: yes Nausea:  no Vomiting: no Photophobia:  yes Phonophobia:  yes Effect on social functioning:  no Numbers of missed days of school/work each month: 0 Confusion:  no Gait disturbance/ataxia:  no Behavioral changes:  no Fevers:  no   HYPERLIPIDEMIA Taking Repatha  and Zetia . Unable to take statins due to myalgias, even with 3 or 1 day week dosing.  CT cardiac scoring showed score of 0 on 01/13/22.  Had visit with lipid clinic in April 2024.  Significant family history of heart disease, mother had MI. Hyperlipidemia status: good compliance Satisfied with current treatment?   yes Side effects:  no Medication compliance: good compliance Past cholesterol meds: multiples statins and Zetia  Supplements: none Aspirin:  no The 10-year ASCVD risk score (Arnett DK, et al., 2019) is: 4.6%   Values used to calculate the score:     Age: 19 years     Clincally relevant sex: Female     Is Non-Hispanic African American: No     Diabetic: No     Tobacco smoker: No     Systolic Blood Pressure: 126 mmHg     Is BP treated: No     HDL Cholesterol: 46 mg/dL     Total Cholesterol: 154 mg/dL Chest pain:  no Coronary artery disease:  yes Family history CAD:  yes Family history early CAD:  yes   OSTEOPENIA Had DEXA in 2017.  No recent falls or fractures. Satisfied with current treatment?: yes Adequate calcium  & vitamin D : yes Weight bearing exercises: yes   DEPRESSION Taking Zoloft daily.   Mood status: stable Satisfied with current treatment?: yes Symptom severity: moderate  Duration of current treatment : chronic Side effects: no Medication compliance: good compliance Psychotherapy/counseling: none Depressed mood: no Anxious mood: no Anhedonia: no Significant weight loss or gain: no Insomnia: none Fatigue: no Feelings of worthlessness or guilt: no Impaired concentration/indecisiveness: no Suicidal ideations: no Hopelessness: no Crying spells: yes    11/17/2023   10:55 AM 04/06/2023    4:28 PM 10/08/2022    8:48 AM 02/01/2022    9:56 AM 08/03/2021   10:20 AM  Depression screen PHQ 2/9  Decreased Interest 0 0 0 0 0  Down, Depressed, Hopeless 0 0 0 0 1  PHQ - 2 Score 0 0 0 0 1  Altered sleeping 0 0 0 0 1  Tired, decreased energy 0 0 0 0 0  Change in appetite 0 0 0 0 0  Feeling bad or failure about yourself  0 0 0 0 0  Trouble concentrating 0 0 0 0 1  Moving slowly or fidgety/restless 0 0 0 0 0  Suicidal thoughts 0 0 0 0 0  PHQ-9 Score 0 0 0 0 3  Difficult doing work/chores Not difficult at all Not difficult at all Not difficult at all Not difficult at  all Not difficult at all       11/17/2023   10:55 AM 04/06/2023    4:28 PM 10/08/2022    8:48 AM 02/01/2022    9:56 AM  GAD 7 : Generalized Anxiety Score  Nervous, Anxious, on Edge 0 0 0 0  Control/stop worrying 0 0 0 0  Worry too much - different things 0 0 0 0  Trouble relaxing 0 0 0 0  Restless 0 0 0 0  Easily annoyed or irritable 0 0 0 0  Afraid - awful might happen 0 0 0 0  Total GAD 7 Score 0 0 0 0  Anxiety Difficulty Not difficult at all Not difficult at all Not difficult at all Not difficult at all       08/03/2021   10:39 AM 02/01/2022    9:56 AM 10/08/2022    8:47 AM 04/06/2023    4:28 PM 11/17/2023   10:50 AM  Fall Risk  Falls in the past year? 0 0 0 0 0  Was there an injury with Fall? 0 0 0 0 0  Fall Risk Category Calculator 0 0 0 0 0  Fall Risk Category (Retired) Low       (RETIRED) Patient Fall Risk Level Low fall risk       Patient at Risk for Falls Due to No Fall Risks No Fall Risks No Fall Risks No Fall Risks No Fall Risks  Fall risk Follow up Falls evaluation completed  Falls evaluation completed  Falls evaluation completed Falls evaluation completed Falls evaluation completed     Data saved with a previous flowsheet row definition     Functional Status Survey:     Past Medical History:  Past Medical History:  Diagnosis Date   Allergic rhinitis    Allergy    Seasonal   Anemia    Anxiety    take medicine   Cataract    Getting them - next 5 years or so per doc   Depression    Dad's side of family   GERD (gastroesophageal reflux disease)    some - indigestion as I get older it is worse   Hypercholesterolemia    Migraine    Osteopenia    Vocal cord polyp     Surgical History:  Past Surgical History:  Procedure Laterality Date   COLONOSCOPY  2015   negative   excision of polyps from vocal cords  1970, 1984, 1985    Medications:  Current Outpatient Medications on File Prior to Visit  Medication Sig   B Complex Vitamins (VITAMIN-B COMPLEX)  TABS Take by mouth.   Calcium  Carb-Cholecalciferol (CALCIUM  CARBONATE-VITAMIN D3) 600-400 MG-UNIT TABS Take by mouth.   cetirizine (ZYRTEC) 10 MG tablet Take by mouth.   diclofenac (VOLTAREN) 75 MG EC tablet Take 75 mg by mouth 2 (two) times daily.  diphenhydrAMINE-APAP, sleep, (TYLENOL PM EXTRA STRENGTH PO) Take by mouth at bedtime as needed.   Esomeprazole Magnesium (NEXIUM PO) Take by mouth daily as needed.   Estradiol  (ELESTRIN ) 0.52 MG/0.87 GM (0.06%) GEL APPLY 1/2 TO 1 PUMP TO UPPER ARM AREA EVERY DAY AS DIRECTED   Misc Natural Products (PETADOLEX 75) 75 MG CAPS Take by mouth.   Omega-3 Fatty Acids (FISH OIL) 1200 MG CAPS Take by mouth daily.   pregabalin (LYRICA) 75 MG capsule Take 75 mg by mouth 2 (two) times daily.   progesterone  (PROMETRIUM ) 100 MG capsule TAKE 1 CAPSULE BY MOUTH ONCE DAILY   REPATHA  SURECLICK 140 MG/ML SOAJ INJECT 140 MG INTO THE SKIN EVERY 14 DAYS.   Sennosides (SENOKOT PO) Take by mouth daily as needed.   sertraline (ZOLOFT) 100 MG tablet Take 1 tablet by mouth daily.   SUMAtriptan (IMITREX) 100 MG tablet Take 1 tablet by mouth as needed.   tretinoin (RETIN-A) 0.025 % cream Apply 1 Application topically at bedtime.   UBRELVY 100 MG TABS Take 1 tablet by mouth daily.   No current facility-administered medications on file prior to visit.    Allergies:  Allergies  Allergen Reactions   Statins Other (See Comments)    Myopathy     Social History:  Social History   Socioeconomic History   Marital status: Married    Spouse name: Not on file   Number of children: 1   Years of education: Not on file   Highest education level: Not on file  Occupational History   Occupation: Environmental Health Practitioner, Passenger Transport Manager  Tobacco Use   Smoking status: Never   Smokeless tobacco: Never  Vaping Use   Vaping status: Never Used  Substance and Sexual Activity   Alcohol use: Yes    Comment: 1/week   Drug use: No   Sexual activity: Yes    Birth control/protection:  Post-menopausal  Other Topics Concern   Not on file  Social History Narrative   Daughter graduated from [employer] 2020 thea(took theater and communications).   Social Drivers of Corporate Investment Banker Strain: Low Risk  (06/15/2023)   Received from [facility]   Overall Financial Resource Strain (CARDIA)    Difficulty of Paying Living Expenses: Not hard at all  Food Insecurity: No Food Insecurity (08/09/2023)   Received from [facility]   Hunger Vital Sign    Within the past 12 months, you worried that your food would run out before you got the money to buy more.: Never true    Within the past 12 months, the food you bought just didn't last and you didn't have money to get more.: Never true  Transportation Needs: No Transportation Needs (08/09/2023)   Received from [facility] - Transportation    In the past 12 months, has lack of transportation kept you from medical appointments or from getting medications?: No    In the past 12 months, has lack of transportation kept you from meetings, work, or from getting things needed for daily living?: No  Physical Activity: Insufficiently Active (02/03/2018)   Exercise Vital Sign    Days of Exercise per Week: 3 days    Minutes of Exercise per Session: 30 min  Stress: No Stress Concern Present (08/09/2023)   Received from [facility] - Occupational Stress Questionnaire    Do you feel stress - tense, restless, nervous, or anxious, or unable to sleep at night because  your mind is troubled all the time - these days?: Not at all  Social Connections: Moderately Integrated (12/06/2016)   Social Connection and Isolation Panel    Frequency of Communication with Friends and Family: Three times a week    Frequency of Social Gatherings with Friends and Family: Once a week    Attends Religious Services: More than 4 times per year    Active Member of [employer] or Organizations: No    Attends  Banker Meetings: Never    Marital Status: Married  Catering Manager Violence: Not At Risk (08/09/2023)   Received from [facility]   HITS    Over the last 12 months how often did your partner physically hurt you?: Never    Over the last 12 months how often did your partner insult you or talk down to you?: Never    Over the last 12 months how often did your partner threaten you with physical harm?: Never    Over the last 12 months how often did your partner scream or curse at you?: Never   Social History   Tobacco Use  Smoking Status Never  Smokeless Tobacco Never   Social History   Substance and Sexual Activity  Alcohol Use Yes   Comment: 1/week    Family History:  Family History  Problem Relation Age of Onset   Diabetes Mother    Colon polyps Mother 73   Heart disease Mother        MI has pacemaker   Hypertension Mother    Heart attack Mother    Obesity Mother    Varicose Veins Mother    Diabetes Father    Depression Father    Squamous cell carcinoma Sister        skin on arm   Skin cancer Maternal Aunt    Arthritis Maternal Grandmother    Asthma Maternal Grandmother    Kidney disease Maternal Grandmother    Obesity Maternal Grandmother    Stroke Maternal Grandmother    Breast cancer Neg Hx     Past medical history, surgical history, medications, allergies, family history and social history reviewed with patient today and changes made to appropriate areas of the chart.   ROS All other ROS negative except what is listed above and in the HPI.      Objective:    BP 126/79   Pulse 96   Temp 98.7 F (37.1 C) (Oral)   Ht 5' 5.4 (1.661 m)   Wt 202 lb (91.6 kg)   SpO2 96%   BMI 33.20 kg/m   Wt Readings from Last 3 Encounters:  11/17/23 202 lb (91.6 kg)  10/12/23 200 lb (90.7 kg)  06/16/23 202 lb 14.4 oz (92 kg)    Physical Exam Vitals and nursing note reviewed. Exam conducted with a chaperone present.  Constitutional:      General: She  is awake. She is not in acute distress.    Appearance: She is well-developed and well-groomed. She is obese. She is not ill-appearing or toxic-appearing.  HENT:     Head: Normocephalic and atraumatic.     Right Ear: Hearing, tympanic membrane, ear canal and external ear normal. No drainage.     Left Ear: Hearing, tympanic membrane, ear canal and external ear normal. No drainage.     Nose: Nose normal.     Right Sinus: No maxillary sinus tenderness or frontal sinus tenderness.     Left Sinus: No maxillary sinus tenderness or frontal sinus  tenderness.     Mouth/Throat:     Mouth: Mucous membranes are moist.     Pharynx: Oropharynx is clear. Uvula midline. No pharyngeal swelling, oropharyngeal exudate or posterior oropharyngeal erythema.  Eyes:     General: Lids are normal.        Right eye: No discharge.        Left eye: No discharge.     Extraocular Movements: Extraocular movements intact.     Conjunctiva/sclera: Conjunctivae normal.     Pupils: Pupils are equal, round, and reactive to light.     Visual Fields: Right eye visual fields normal and left eye visual fields normal.  Neck:     Thyroid: No thyromegaly.     Vascular: No carotid bruit.     Trachea: Trachea normal.  Cardiovascular:     Rate and Rhythm: Normal rate and regular rhythm.     Heart sounds: Normal heart sounds. No murmur heard.    No gallop.  Pulmonary:     Effort: Pulmonary effort is normal. No accessory muscle usage or respiratory distress.     Breath sounds: Normal breath sounds.  Abdominal:     General: Bowel sounds are normal.     Palpations: Abdomen is soft. There is no hepatomegaly or splenomegaly.     Tenderness: There is no abdominal tenderness.  Musculoskeletal:        General: Normal range of motion.     Cervical back: Normal range of motion and neck supple.     Right lower leg: No edema.     Left lower leg: No edema.  Lymphadenopathy:     Head:     Right side of head: No submental, submandibular,  tonsillar, preauricular or posterior auricular adenopathy.     Left side of head: No submental, submandibular, tonsillar, preauricular or posterior auricular adenopathy.     Cervical: No cervical adenopathy.  Skin:    General: Skin is warm and dry.     Capillary Refill: Capillary refill takes less than 2 seconds.     Findings: No rash.  Neurological:     Mental Status: She is alert and oriented to person, place, and time.     Gait: Gait is intact.     Deep Tendon Reflexes: Reflexes are normal and symmetric.     Reflex Scores:      Brachioradialis reflexes are 2+ on the right side and 2+ on the left side.      Patellar reflexes are 2+ on the right side and 2+ on the left side. Psychiatric:        Attention and Perception: Attention normal.        Mood and Affect: Mood normal.        Speech: Speech normal.        Behavior: Behavior normal. Behavior is cooperative.        Thought Content: Thought content normal.        Judgment: Judgment normal.    Results for orders placed or performed during the hospital encounter of 10/12/23  CBC with Differential   Collection Time: 10/12/23 12:51 PM  Result Value Ref Range   WBC 9.9 4.0 - 10.5 K/uL   RBC 3.53 (L) 3.87 - 5.11 MIL/uL   Hemoglobin 10.3 (L) 12.0 - 15.0 g/dL   HCT 67.3 (L) 63.9 - 53.9 %   MCV 92.4 80.0 - 100.0 fL   MCH 29.2 26.0 - 34.0 pg   MCHC 31.6 30.0 - 36.0 g/dL   RDW  13.2 11.5 - 15.5 %   Platelets 655 (H) 150 - 400 K/uL   nRBC 0.0 0.0 - 0.2 %   Neutrophils Relative % 67 %   Neutro Abs 6.7 1.7 - 7.7 K/uL   Lymphocytes Relative 25 %   Lymphs Abs 2.5 0.7 - 4.0 K/uL   Monocytes Relative 5 %   Monocytes Absolute 0.5 0.1 - 1.0 K/uL   Eosinophils Relative 2 %   Eosinophils Absolute 0.2 0.0 - 0.5 K/uL   Basophils Relative 1 %   Basophils Absolute 0.1 0.0 - 0.1 K/uL   Immature Granulocytes 0 %   Abs Immature Granulocytes 0.03 0.00 - 0.07 K/uL  Comprehensive metabolic panel   Collection Time: 10/12/23 12:51 PM  Result Value  Ref Range   Sodium 138 135 - 145 mmol/L   Potassium 3.8 3.5 - 5.1 mmol/L   Chloride 103 98 - 111 mmol/L   CO2 22 22 - 32 mmol/L   Glucose, Bld 121 (H) 70 - 99 mg/dL   BUN 17 8 - 23 mg/dL   Creatinine, Ser 9.27 0.44 - 1.00 mg/dL   Calcium  9.0 8.9 - 10.3 mg/dL   Total Protein 8.3 (H) 6.5 - 8.1 g/dL   Albumin 3.2 (L) 3.5 - 5.0 g/dL   AST 26 15 - 41 U/L   ALT 18 0 - 44 U/L   Alkaline Phosphatase 135 (H) 38 - 126 U/L   Total Bilirubin 0.3 0.0 - 1.2 mg/dL   GFR, Estimated >39 >39 mL/min   Anion gap 13 5 - 15  APTT   Collection Time: 10/12/23 12:51 PM  Result Value Ref Range   aPTT 42 (H) 24 - 36 seconds  Protime-INR   Collection Time: 10/12/23 12:51 PM  Result Value Ref Range   Prothrombin Time 13.2 11.4 - 15.2 seconds   INR 1.0 0.8 - 1.2      Assessment & Plan:   Problem List Items Addressed This Visit       Cardiovascular and Mediastinum   Intractable migraine with aura without status migrainosus   Chronic, stable with minimal migraines.  Followed by neurology, continue this collaboration and current medication regimen as prescribed by them.  Return in 6 months.      Relevant Medications   pregabalin (LYRICA) 75 MG capsule   diclofenac (VOLTAREN) 75 MG EC tablet   furosemide (LASIX) 20 MG tablet   ezetimibe  (ZETIA ) 10 MG tablet   Other Relevant Orders   CBC with Differential/Platelet   TSH     Musculoskeletal and Integument   Osteopenia of neck of right femur   Noted on DEXA in 2017 with T -1.6.  Continue daily supplements, Calcium  and Vitamin D .  Plan to repeat DEXA next year at physical, will be 64.  Check Vit D level today.      Relevant Orders   VITAMIN D  25 Hydroxy (Vit-D Deficiency, Fractures)     Other   Obesity   BMI 33.20, is maintaining past weight loss.  Recommended eating smaller high protein, low fat meals more frequently and exercising 30 mins a day 5 times a week with a goal of 10-15lb weight loss in the next 3 months. Patient voiced their  understanding and motivation to adhere to these recommendations.       Myalgia due to statin   Has history of poor tolerance to multiple statins & Zetia , but currently tolerating Repatha  with Zetia  well.  Lipid panel today.        Menopausal  syndrome on hormone replacement therapy   Follows with GYN, continue this collaboration. Recent notes reviewed.      Hypercholesterolemia   Chronic, ongoing.  Continue Repatha  and Zetia , has seen lipid clinic and they agree with continuing these per notes reviewed.  Did have a 0 on her CT Cardiac scoring.  Poor tolerance to multiple statins in past.  Significant family cardiac history, mother had MI in 84's and without medication patient LDL >190.  Lipid panel and CMP today.  Continue collaboration with lipid specialist as needed, recent note reviewed.      Relevant Medications   furosemide (LASIX) 20 MG tablet   ezetimibe  (ZETIA ) 10 MG tablet   Other Relevant Orders   Comprehensive metabolic panel with GFR   Lipid Panel w/o Chol/HDL Ratio   History of lumbar spinal fusion   On 08/09/23, continue collaboration with [facility].  Lasix 10 MG PRN sent in to take as needed only for swelling if presents, educated her on this.  May need to reduce Lyrica or Diclofenac if swelling continues, she reports ortho stated similar.      Depression, major, single episode, mild - Primary   Chronic, ongoing.  Continue this regimen and adjust as needed.  Stable on regimen at this time.  Denies SI/HI.  Return in 6 months.      Other Visit Diagnoses       Encounter for annual physical exam       Annual physical today with labs and health maintenance reviewed, discussed with patient.        Follow up plan: Return in about 6 months (around 05/17/2024) for Depression, Migraines, HLD.   LABORATORY TESTING:  - Pap smear: up to date  IMMUNIZATIONS:   - Tdap: Tetanus vaccination status reviewed: last tetanus booster within 10 years. - Influenza: Up to  date - Pneumovax: Not applicable - Prevnar: Refuses today - COVID: Up to date - HPV: Not applicable - Shingrix  vaccine: Up to date  SCREENING: -Mammogram: Up to date due next 08/04/24 - Colonoscopy: Up to date Cologuard due next 04/19/24 - Bone Density: Up To Date -Hearing Test: Not applicable  -Spirometry: Not applicable   PATIENT COUNSELING:   Advised to take 1 mg of folate supplement per day if capable of pregnancy.   Sexuality: Discussed sexually transmitted diseases, partner selection, use of condoms, avoidance of unintended pregnancy  and contraceptive alternatives.   Advised to avoid cigarette smoking.  I discussed with the patient that most people either abstain from alcohol or drink within safe limits (<=14/week and <=4 drinks/occasion for males, <=7/weeks and <= 3 drinks/occasion for females) and that the risk for alcohol disorders and other health effects rises proportionally with the number of drinks per week and how often a drinker exceeds daily limits.  Discussed cessation/primary prevention of drug use and availability of treatment for abuse.   Diet: Encouraged to adjust caloric intake to maintain  or achieve ideal body weight, to reduce intake of dietary saturated fat and total fat, to limit sodium intake by avoiding high sodium foods and not adding table salt, and to maintain adequate dietary potassium and calcium  preferably from fresh fruits, vegetables, and low-fat dairy products.    Stressed the importance of regular exercise  Injury prevention: Discussed safety belts, safety helmets, smoke detector, smoking near bedding or upholstery.   Dental health: Discussed importance of regular tooth brushing, flossing, and dental visits.    NEXT PREVENTATIVE PHYSICAL DUE IN 1 YEAR. Return in about 6  months (around 05/17/2024) for Depression, Migraines, HLD.

## 2023-11-17 NOTE — Assessment & Plan Note (Signed)
 On 08/09/23, continue collaboration with [facility].  Lasix 10 MG PRN sent in to take as needed only for swelling if presents, educated her on this.  May need to reduce Lyrica or Diclofenac if swelling continues, she reports ortho stated similar.

## 2023-11-17 NOTE — Assessment & Plan Note (Signed)
 Follows with GYN, continue this collaboration. Recent notes reviewed.

## 2023-11-17 NOTE — Assessment & Plan Note (Signed)
 Chronic, ongoing.  Continue Repatha and Zetia, has seen lipid clinic and they agree with continuing these per notes reviewed.  Did have a 0 on her CT Cardiac scoring.  Poor tolerance to multiple statins in past.  Significant family cardiac history, mother had MI in 78's and without medication patient LDL >190.  Lipid panel and CMP today.  Continue collaboration with lipid specialist as needed, recent note reviewed.

## 2023-11-18 ENCOUNTER — Ambulatory Visit: Payer: Self-pay | Admitting: Nurse Practitioner

## 2023-11-18 DIAGNOSIS — D649 Anemia, unspecified: Secondary | ICD-10-CM

## 2023-11-18 LAB — CBC WITH DIFFERENTIAL/PLATELET
Basophils Absolute: 0.1 x10E3/uL (ref 0.0–0.2)
Basos: 1 %
EOS (ABSOLUTE): 0.3 x10E3/uL (ref 0.0–0.4)
Eos: 4 %
Hematocrit: 34.8 % (ref 34.0–46.6)
Hemoglobin: 10.9 g/dL — ABNORMAL LOW (ref 11.1–15.9)
Immature Grans (Abs): 0 x10E3/uL (ref 0.0–0.1)
Immature Granulocytes: 0 %
Lymphocytes Absolute: 2 x10E3/uL (ref 0.7–3.1)
Lymphs: 26 %
MCH: 28.5 pg (ref 26.6–33.0)
MCHC: 31.3 g/dL — ABNORMAL LOW (ref 31.5–35.7)
MCV: 91 fL (ref 79–97)
Monocytes Absolute: 0.4 x10E3/uL (ref 0.1–0.9)
Monocytes: 5 %
Neutrophils Absolute: 4.9 x10E3/uL (ref 1.4–7.0)
Neutrophils: 64 %
Platelets: 534 x10E3/uL — ABNORMAL HIGH (ref 150–450)
RBC: 3.82 x10E6/uL (ref 3.77–5.28)
RDW: 13.7 % (ref 11.7–15.4)
WBC: 7.7 x10E3/uL (ref 3.4–10.8)

## 2023-11-18 LAB — LIPID PANEL W/O CHOL/HDL RATIO
Cholesterol, Total: 156 mg/dL (ref 100–199)
HDL: 39 mg/dL — ABNORMAL LOW (ref >39)
LDL Chol Calc (NIH): 88 mg/dL (ref 0–99)
Triglycerides: 166 mg/dL — ABNORMAL HIGH (ref 0–149)
VLDL Cholesterol Cal: 29 mg/dL (ref 5–40)

## 2023-11-18 LAB — COMPREHENSIVE METABOLIC PANEL WITH GFR
ALT: 14 IU/L (ref 0–32)
AST: 25 IU/L (ref 0–40)
Albumin: 4 g/dL (ref 3.9–4.9)
Alkaline Phosphatase: 139 IU/L — ABNORMAL HIGH (ref 49–135)
BUN/Creatinine Ratio: 23 (ref 12–28)
BUN: 16 mg/dL (ref 8–27)
Bilirubin Total: 0.3 mg/dL (ref 0.0–1.2)
CO2: 25 mmol/L (ref 20–29)
Calcium: 9.4 mg/dL (ref 8.7–10.3)
Chloride: 100 mmol/L (ref 96–106)
Creatinine, Ser: 0.71 mg/dL (ref 0.57–1.00)
Globulin, Total: 3.6 g/dL (ref 1.5–4.5)
Glucose: 84 mg/dL (ref 70–99)
Potassium: 4.7 mmol/L (ref 3.5–5.2)
Sodium: 139 mmol/L (ref 134–144)
Total Protein: 7.6 g/dL (ref 6.0–8.5)
eGFR: 95 mL/min/1.73 (ref >59)

## 2023-11-18 LAB — TSH: TSH: 2.37 u[IU]/mL (ref 0.450–4.500)

## 2023-11-18 LAB — VITAMIN D 25 HYDROXY (VIT D DEFICIENCY, FRACTURES): Vit D, 25-Hydroxy: 59.9 ng/mL (ref 30.0–100.0)

## 2023-11-18 NOTE — Progress Notes (Signed)
 Scheduled

## 2023-11-18 NOTE — Progress Notes (Signed)
 Contacted via MyChart -- lab only visit in 4 weeks please  Good afternoon Margaret Fuller, your labs have returned: - CBC still showing slightly low hemoglobin and slightly elevated platelet.  These are improving though and most likely related to your recent surgery. I would like to recheck outpatient in 4 weeks to ensure these continue to improve. Add some iron rich foods to diet. - Lipid panel is showing stable levels but would like to see LDL <70.  Continue current medications. - Kidney function, creatinine and eGFR, remains normal, as is liver function, AST and ALT.  - Remainder of labs stable.  Any questions? Keep being amazing!!  Thank you for allowing me to participate in your care.  I appreciate you. Kindest regards, Johathon Overturf

## 2023-11-25 ENCOUNTER — Encounter: Payer: Self-pay | Admitting: Nurse Practitioner

## 2023-12-19 ENCOUNTER — Other Ambulatory Visit

## 2023-12-19 DIAGNOSIS — D649 Anemia, unspecified: Secondary | ICD-10-CM

## 2023-12-20 ENCOUNTER — Ambulatory Visit: Payer: Self-pay | Admitting: Nurse Practitioner

## 2023-12-20 LAB — CBC WITH DIFFERENTIAL/PLATELET
Basophils Absolute: 0.1 x10E3/uL (ref 0.0–0.2)
Basos: 2 %
EOS (ABSOLUTE): 0.3 x10E3/uL (ref 0.0–0.4)
Eos: 5 %
Hematocrit: 32.1 % — ABNORMAL LOW (ref 34.0–46.6)
Hemoglobin: 10.4 g/dL — ABNORMAL LOW (ref 11.1–15.9)
Immature Grans (Abs): 0 x10E3/uL (ref 0.0–0.1)
Immature Granulocytes: 0 %
Lymphocytes Absolute: 1.9 x10E3/uL (ref 0.7–3.1)
Lymphs: 29 %
MCH: 29.1 pg (ref 26.6–33.0)
MCHC: 32.4 g/dL (ref 31.5–35.7)
MCV: 90 fL (ref 79–97)
Monocytes Absolute: 0.4 x10E3/uL (ref 0.1–0.9)
Monocytes: 6 %
Neutrophils Absolute: 3.9 x10E3/uL (ref 1.4–7.0)
Neutrophils: 58 %
Platelets: 457 x10E3/uL — ABNORMAL HIGH (ref 150–450)
RBC: 3.58 x10E6/uL — ABNORMAL LOW (ref 3.77–5.28)
RDW: 14.5 % (ref 11.7–15.4)
WBC: 6.6 x10E3/uL (ref 3.4–10.8)

## 2023-12-20 NOTE — Progress Notes (Signed)
 Called patient and left a message to call back to get scheduled for labs. lab only visit in 4 weeks

## 2023-12-20 NOTE — Progress Notes (Signed)
 Contacted via MyChart -- lab only visit in 4 weeks  Good morning Shanicka, your labs have returned and hemoglobin and hematocrit remain a little low. I would like to recheck again in [redacted] weeks along with checking iron and ferritin. Ensure you are eating an iron rich diet at home. Any questions? Keep being stellar!!  Thank you for allowing me to participate in your care.  I appreciate you. Kindest regards, Jerad Dunlap

## 2023-12-22 NOTE — Progress Notes (Signed)
 Called patient, lvm for patient to call back and schedule 4 week lab appt.

## 2024-01-23 ENCOUNTER — Other Ambulatory Visit

## 2024-01-25 ENCOUNTER — Encounter: Payer: Self-pay | Admitting: Student

## 2024-01-25 ENCOUNTER — Ambulatory Visit: Admitting: Student

## 2024-01-25 VITALS — BP 126/86 | HR 81 | Temp 98.4°F | Ht 65.4 in | Wt 205.0 lb

## 2024-01-25 DIAGNOSIS — R058 Other specified cough: Secondary | ICD-10-CM | POA: Diagnosis not present

## 2024-01-25 NOTE — Progress Notes (Signed)
 "  Established Patient Office Visit  Subjective   Patient ID: Margaret Fuller, female    DOB: Sep 09, 1960  Age: 64 y.o. MRN: [id number]  Chief Complaint  Patient presents with   Cough    X 2 weeks, had positive exposure to Flu by family members, had diarrhea, body aches, fever, feels much better now but has a cough that will not go away, has used mucinex, productive cough with yellow/green phlegm   Wheezing    Margaret Fuller is a 64 y.o. person with medical hx listed below who presents today for cough for started around 12/26. Associated fever, chills, diarrhea x2 that has since improved. Multiple family members at the time had a the flue and she think she was exposed. Ded have a flu vaccine this year. Did not get tested. Has a lingering cough with green/yellow sputum. Is taking mucinex. Does decribes ome chest heaviness, and also feels like she is wheezing No hx of lung disease.   No further fevers, CP, dyspnea, abdominal pain, n/v/d, dizziness.    Patient Active Problem List   Diagnosis Date Noted   History of lumbar spinal fusion 11/17/2023   Menopausal syndrome on hormone replacement therapy 11/17/2023   Cervical radiculopathy 09/15/2022   Family history of MI (myocardial infarction) 08/07/2020   Family history of hyperlipidemia 08/07/2020   Myalgia due to statin 01/22/2020   History of anemia 07/26/2019   Depression, major, single episode, mild 07/26/2019   Obesity 07/26/2019   Osteopenia of neck of right femur    Intractable migraine with aura without status migrainosus    Hypercholesterolemia    Allergic rhinitis    Vocal cord polyp       ROS Refer to HPI    Objective:     Outpatient Encounter Medications as of 01/25/2024  Medication Sig   B Complex Vitamins (VITAMIN-B COMPLEX) TABS Take by mouth.   Calcium  Carb-Cholecalciferol (CALCIUM  CARBONATE-VITAMIN D3) 600-400 MG-UNIT TABS Take by mouth.   cetirizine (ZYRTEC) 10 MG tablet Take by  mouth.   diphenhydrAMINE-APAP, sleep, (TYLENOL PM EXTRA STRENGTH PO) Take by mouth at bedtime as needed.   Esomeprazole Magnesium (NEXIUM PO) Take by mouth daily as needed.   Estradiol  (ELESTRIN ) 0.52 MG/0.87 GM (0.06%) GEL APPLY 1/2 TO 1 PUMP TO UPPER ARM AREA EVERY DAY AS DIRECTED   ezetimibe  (ZETIA ) 10 MG tablet Take 1 tablet (10 mg total) by mouth daily.   furosemide  (LASIX ) 20 MG tablet Take 0.5 tablets (10 mg total) by mouth daily as needed. For edema if needed.   Misc Natural Products (PETADOLEX 75) 75 MG CAPS Take by mouth.   Omega-3 Fatty Acids (FISH OIL) 1200 MG CAPS Take by mouth daily.   pregabalin (LYRICA) 75 MG capsule Take 75 mg by mouth 2 (two) times daily.   progesterone  (PROMETRIUM ) 100 MG capsule TAKE 1 CAPSULE BY MOUTH ONCE DAILY   Sennosides (SENOKOT PO) Take by mouth daily as needed.   sertraline (ZOLOFT) 100 MG tablet Take 1 tablet by mouth daily.   SUMAtriptan (IMITREX) 100 MG tablet Take 1 tablet by mouth as needed.   tretinoin (RETIN-A) 0.025 % cream Apply 1 Application topically at bedtime.   UBRELVY 100 MG TABS Take 1 tablet by mouth daily.   [DISCONTINUED] REPATHA  SURECLICK 140 MG/ML SOAJ INJECT 140 MG INTO THE SKIN EVERY 14 DAYS.   No facility-administered encounter medications on file as of 01/25/2024.    BP 126/86   Pulse 81   Temp 98.4 F (36.9  C) (Oral)   Ht 5' 5.4 (1.661 m)   Wt 205 lb (93 kg)   SpO2 96%   BMI 33.70 kg/m  BP Readings from Last 3 Encounters:  01/25/24 126/86  11/17/23 126/79  10/12/23 121/63    Physical Exam Constitutional:      Appearance: Normal appearance.  HENT:     Mouth/Throat:     Mouth: Mucous membranes are moist.     Pharynx: Oropharynx is clear.  Cardiovascular:     Rate and Rhythm: Normal rate and regular rhythm.  Pulmonary:     Effort: Pulmonary effort is normal. No respiratory distress.     Breath sounds: Normal breath sounds. No rhonchi or rales.     Comments:   Abdominal:     General: Abdomen is flat.  Bowel sounds are normal. There is no distension.     Palpations: Abdomen is soft.     Tenderness: There is no abdominal tenderness.  Musculoskeletal:        General: Normal range of motion.     Right lower leg: No edema.     Left lower leg: No edema.  Skin:    General: Skin is warm and dry.     Capillary Refill: Capillary refill takes less than 2 seconds.  Neurological:     General: No focal deficit present.     Mental Status: She is alert and oriented to person, place, and time.  Psychiatric:        Mood and Affect: Mood normal.        Behavior: Behavior normal.        11/17/2023   10:55 AM 04/06/2023    4:28 PM 10/08/2022    8:48 AM  Depression screen PHQ 2/9  Decreased Interest 0 0 0  Down, Depressed, Hopeless 0 0 0  PHQ - 2 Score 0 0 0  Altered sleeping 0 0 0  Tired, decreased energy 0 0 0  Change in appetite 0 0 0  Feeling bad or failure about yourself  0 0 0  Trouble concentrating 0 0 0  Moving slowly or fidgety/restless 0 0 0  Suicidal thoughts 0 0 0  PHQ-9 Score 0  0  0   Difficult doing work/chores Not difficult at all Not difficult at all Not difficult at all     Data saved with a previous flowsheet row definition       11/17/2023   10:55 AM 04/06/2023    4:28 PM 10/08/2022    8:48 AM 02/01/2022    9:56 AM  GAD 7 : Generalized Anxiety Score  Nervous, Anxious, on Edge 0 0 0 0  Control/stop worrying 0 0 0 0  Worry too much - different things 0 0 0 0  Trouble relaxing 0 0 0 0  Restless 0 0 0 0  Easily annoyed or irritable 0 0 0 0  Afraid - awful might happen 0 0 0 0  Total GAD 7 Score 0 0 0 0  Anxiety Difficulty Not difficult at all Not difficult at all Not difficult at all Not difficult at all    No results found for any visits on 01/25/24.    The 10-year ASCVD risk score (Arnett DK, et al., 2019) is: 5%    Assessment & Plan:  Post-viral cough syndrome Suspect linger cough is post viral cough. No wheezing on exam. Vitals stable, well appearing.  Continue mucinex as needed. Discussed disease course of URI. Return precautions reviewed.    Return if  symptoms worsen or fail to improve.    Margaret Saddler, MD "

## 2024-01-30 ENCOUNTER — Other Ambulatory Visit: Payer: Self-pay | Admitting: Licensed Practical Nurse

## 2024-01-30 ENCOUNTER — Other Ambulatory Visit: Payer: Self-pay | Admitting: Nurse Practitioner

## 2024-01-30 DIAGNOSIS — N951 Menopausal and female climacteric states: Secondary | ICD-10-CM

## 2024-02-06 ENCOUNTER — Other Ambulatory Visit

## 2024-02-06 DIAGNOSIS — D649 Anemia, unspecified: Secondary | ICD-10-CM

## 2024-02-07 ENCOUNTER — Ambulatory Visit: Payer: Self-pay | Admitting: Nurse Practitioner

## 2024-02-07 LAB — IRON,TIBC AND FERRITIN PANEL
Ferritin: 89 ng/mL (ref 15–150)
Iron Saturation: 24 % (ref 15–55)
Iron: 79 ug/dL (ref 27–139)
Total Iron Binding Capacity: 336 ug/dL (ref 250–450)
UIBC: 257 ug/dL (ref 118–369)

## 2024-02-07 LAB — CBC WITH DIFFERENTIAL/PLATELET
Basophils Absolute: 0.1 x10E3/uL (ref 0.0–0.2)
Basos: 2 %
EOS (ABSOLUTE): 0.3 x10E3/uL (ref 0.0–0.4)
Eos: 5 %
Hematocrit: 38 % (ref 34.0–46.6)
Hemoglobin: 11.9 g/dL (ref 11.1–15.9)
Immature Grans (Abs): 0 x10E3/uL (ref 0.0–0.1)
Immature Granulocytes: 0 %
Lymphocytes Absolute: 2.1 x10E3/uL (ref 0.7–3.1)
Lymphs: 35 %
MCH: 30.3 pg (ref 26.6–33.0)
MCHC: 31.3 g/dL — ABNORMAL LOW (ref 31.5–35.7)
MCV: 97 fL (ref 79–97)
Monocytes Absolute: 0.4 x10E3/uL (ref 0.1–0.9)
Monocytes: 6 %
Neutrophils Absolute: 3.1 x10E3/uL (ref 1.4–7.0)
Neutrophils: 52 %
Platelets: 371 x10E3/uL (ref 150–450)
RBC: 3.93 x10E6/uL (ref 3.77–5.28)
RDW: 15.3 % (ref 11.7–15.4)
WBC: 6 x10E3/uL (ref 3.4–10.8)

## 2024-02-07 NOTE — Progress Notes (Signed)
 Contacted via MyChart  Levels much improved this check, suspect mild anemia was more related to your recent surgery at the time. Great news!!

## 2024-05-18 ENCOUNTER — Ambulatory Visit: Admitting: Nurse Practitioner
